# Patient Record
Sex: Male | Born: 1935
Health system: Southern US, Community
[De-identification: ages and names within clinical notes are randomized; demographics above are authoritative.]

## PROBLEM LIST (undated history)

## (undated) DIAGNOSIS — R001 Bradycardia, unspecified: Secondary | ICD-10-CM

## (undated) DIAGNOSIS — I10 Essential (primary) hypertension: Secondary | ICD-10-CM

## (undated) DIAGNOSIS — I350 Nonrheumatic aortic (valve) stenosis: Secondary | ICD-10-CM

## (undated) DIAGNOSIS — C4490 Unspecified malignant neoplasm of skin, unspecified: Secondary | ICD-10-CM

## (undated) DIAGNOSIS — G2581 Restless legs syndrome: Secondary | ICD-10-CM

## (undated) DIAGNOSIS — J309 Allergic rhinitis, unspecified: Secondary | ICD-10-CM

## (undated) HISTORY — DX: Unspecified malignant neoplasm of skin, unspecified: C44.90

## (undated) HISTORY — PX: LUMBAR DISC SURGERY: SHX700

## (undated) HISTORY — PX: CATARACT EXTRACTION: SUR2

## (undated) HISTORY — DX: Nonrheumatic aortic (valve) stenosis: I35.0

## (undated) HISTORY — PX: CAROTID ENDARTERECTOMY: SUR193

## (undated) HISTORY — DX: Bradycardia, unspecified: R00.1

## (undated) HISTORY — PX: OTHER SURGICAL HISTORY: SHX169

## (undated) HISTORY — PX: PACEMAKER PLACEMENT: SHX43

## (undated) HISTORY — DX: Allergic rhinitis, unspecified: J30.9

## (undated) HISTORY — DX: Restless legs syndrome: G25.81

## (undated) HISTORY — PX: REPLACEMENT TOTAL KNEE: SUR1224

## (undated) HISTORY — DX: Essential (primary) hypertension: I10

---

## 2004-03-29 ENCOUNTER — Encounter: Admission: RE | Admit: 2004-03-29 | Discharge: 2004-03-29 | Payer: Self-pay

## 2011-07-04 DIAGNOSIS — I6521 Occlusion and stenosis of right carotid artery: Secondary | ICD-10-CM | POA: Insufficient documentation

## 2011-12-06 DIAGNOSIS — M4310 Spondylolisthesis, site unspecified: Secondary | ICD-10-CM | POA: Insufficient documentation

## 2011-12-06 DIAGNOSIS — M48061 Spinal stenosis, lumbar region without neurogenic claudication: Secondary | ICD-10-CM | POA: Insufficient documentation

## 2015-07-28 DIAGNOSIS — E119 Type 2 diabetes mellitus without complications: Secondary | ICD-10-CM | POA: Insufficient documentation

## 2015-07-28 DIAGNOSIS — I350 Nonrheumatic aortic (valve) stenosis: Secondary | ICD-10-CM | POA: Insufficient documentation

## 2015-07-28 DIAGNOSIS — Z950 Presence of cardiac pacemaker: Secondary | ICD-10-CM | POA: Insufficient documentation

## 2015-07-28 DIAGNOSIS — R0609 Other forms of dyspnea: Secondary | ICD-10-CM | POA: Insufficient documentation

## 2015-07-28 DIAGNOSIS — I2510 Atherosclerotic heart disease of native coronary artery without angina pectoris: Secondary | ICD-10-CM | POA: Insufficient documentation

## 2015-07-28 DIAGNOSIS — Z96653 Presence of artificial knee joint, bilateral: Secondary | ICD-10-CM | POA: Insufficient documentation

## 2015-07-28 DIAGNOSIS — Z9889 Other specified postprocedural states: Secondary | ICD-10-CM | POA: Insufficient documentation

## 2015-07-28 DIAGNOSIS — G2581 Restless legs syndrome: Secondary | ICD-10-CM | POA: Insufficient documentation

## 2015-07-28 DIAGNOSIS — Z794 Long term (current) use of insulin: Secondary | ICD-10-CM

## 2015-07-28 DIAGNOSIS — I10 Essential (primary) hypertension: Secondary | ICD-10-CM | POA: Insufficient documentation

## 2015-09-30 DIAGNOSIS — N183 Chronic kidney disease, stage 3 unspecified: Secondary | ICD-10-CM | POA: Insufficient documentation

## 2015-11-27 DIAGNOSIS — G4733 Obstructive sleep apnea (adult) (pediatric): Secondary | ICD-10-CM | POA: Insufficient documentation

## 2016-05-30 DIAGNOSIS — Z01818 Encounter for other preprocedural examination: Secondary | ICD-10-CM | POA: Insufficient documentation

## 2016-06-20 ENCOUNTER — Ambulatory Visit (INDEPENDENT_AMBULATORY_CARE_PROVIDER_SITE_OTHER): Payer: Medicare Other | Admitting: Allergy and Immunology

## 2016-06-20 ENCOUNTER — Encounter: Payer: Self-pay | Admitting: Allergy and Immunology

## 2016-06-20 VITALS — BP 144/76 | HR 86 | Resp 14 | Ht 68.0 in | Wt 216.0 lb

## 2016-06-20 DIAGNOSIS — J3089 Other allergic rhinitis: Secondary | ICD-10-CM

## 2016-06-20 MED ORDER — AZELASTINE-FLUTICASONE 137-50 MCG/ACT NA SUSP: NASAL | 5 refills | Status: DC

## 2016-06-20 NOTE — Patient Instructions (Signed)
  1. House dust mite avoidance measures  2. Dymista one spray each nostril one-2 times a day  3. If needed:   A. nasal saline  B. Allegra 180 one tablet once a day  4. Further treatment? Yes, if plan is inadequate

## 2016-06-20 NOTE — Progress Notes (Signed)
Follow-up Note  Referring Provider: No ref. provider found Primary Provider: Ocie Doyne, MD Date of Office Visit: 06/20/2016  Subjective:   Charles Hahn (DOB: 10-Jan-1934) is a 83 y.o. male who [facility]2016 in re-evaluation of the following:  HPI: [name] presents to this clinic in evaluation of allergic rhinoconjunctivitis. I had seen him in this clinic over 2 years ago for that exact same problem.  He continues to have nasal congestion and runny nose with clear rhinorrhea without a tremendous amount of sneezing or headaches or any other significant upper airway issue. He has tried [facility] but it gave rise to nasal bleeding. He has tried Zyrtec which has not helped him. He has not performed any house dust avoidance measures even though he was very allergic to house dust mite on previous evaluation.  Allergies as of 06/20/2016      Reactions   Nsaids Other (See Comments)   Pt reports stomach ache with NSAIDS   Aspirin Other (See Comments)   Okay with 81 mg dose, but 325 mg dose causes stomach pain      Medication List      aspirin 81 MG tablet Take by mouth.   carvedilol 25 MG tablet Commonly known as:  COREG Take 25 mg by mouth 2 (two) times daily with a meal.   furosemide 40 MG tablet Commonly known as:  LASIX Take 60 mg by mouth daily.   HUMULIN R U-500 KWIKPEN 500 UNIT/ML kwikpen Generic drug:  insulin regular human CONCENTRATED inject 100 units before breakfast, 75 units at lunch, and 75 units at evening meal (30 minutes before the meal)   HYDROcodone-acetaminophen 5-325 MG tablet Commonly known as:  NORCO/VICODIN Take 1 tablet by mouth 4 (four) times daily as needed.   lisinopril 20 MG tablet Commonly known as:  PRINIVIL,ZESTRIL Take by mouth.   rOPINIRole 1 MG tablet Commonly known as:  REQUIP Take by mouth.   simvastatin 10 MG tablet Commonly known as:  ZOCOR Take 10 mg by mouth at bedtime.   tamsulosin 0.4 MG  Caps capsule Commonly known as:  FLOMAX Take 0.4 mg by mouth.   VICTOZA 18 MG/3ML Sopn Generic drug:  liraglutide INJECT 1.2mg  SUBCUTANEOUSLY DAILY       Past Medical History:  Diagnosis Date  . Allergic rhinitis   . Aortic stenosis   . Bradycardia   . Hypertension   . Restless leg syndrome   . Skin cancer    left nasal area    Past Surgical History:  Procedure Laterality Date  . CAROTID ENDARTERECTOMY    . CATARACT EXTRACTION Bilateral   . Inspire Therapy - sleep apnea aid    . LUMBAR DISC SURGERY    . PACEMAKER PLACEMENT    . REPLACEMENT TOTAL KNEE Bilateral   . thumb surgery Bilateral     Review of systems negative except as noted in HPI / PMHx or noted below:  Review of Systems  Constitutional: Negative.   HENT: Negative.   Eyes: Negative.   Respiratory: Negative.        3 weeks ago underwent a stimulator implant for sleep apnea with apparent innervation of hypoglossal nerve  Cardiovascular: Negative.   Gastrointestinal: Negative.   Genitourinary: Negative.   Musculoskeletal: Negative.   Skin: Negative.   Neurological: Negative.   Endo/Heme/Allergies: Negative.   Psychiatric/Behavioral: Negative.      Objective:   Vitals:   06/20/16 1521  BP: Marland Kitchen)  144/76  Pulse: 86  Resp: 14   Height: 5\' 8"  (172.7 cm)  Weight: 216 lb (98 kg)   Physical Exam  Constitutional: He is well-developed, well-nourished, and in no distress.  HENT:  Head: Normocephalic.  Right Ear: Tympanic membrane, external ear and ear canal normal.  Left Ear: Tympanic membrane, external ear and ear canal normal.  Nose: Mucosal edema present. No rhinorrhea.  Mouth/Throat: Uvula is midline, oropharynx is clear and moist and mucous membranes are normal. No oropharyngeal exudate.  Eyes: Conjunctivae are normal.  Neck: Trachea normal. No tracheal tenderness present. No tracheal deviation present. No thyromegaly present.  Cardiovascular: Normal rate, regular rhythm, S1 normal, S2 normal  and normal heart sounds.   No murmur heard. Pulmonary/Chest: Breath sounds normal. No stridor. No respiratory distress. He has no wheezes. He has no rales.  Musculoskeletal: He exhibits no edema.  Lymphadenopathy:       Head (right side): No tonsillar adenopathy present.       Head (left side): No tonsillar adenopathy present.    He has no cervical adenopathy.  Neurological: He is alert. Gait normal.  Skin: No rash noted. He is not diaphoretic. No erythema. Nails show no clubbing.  Psychiatric: Mood and affect normal.    Diagnostics: none   Assessment and Plan:   1. Other allergic rhinitis     1. House dust mite avoidance measures  2. Dymista one spray each nostril one-2 times a day  3. If needed:   A. nasal saline  B. Allegra 180 one tablet once a day  4. Further treatment? Yes, if plan is inadequate  I suspect that Parag will do relatively well if he can perform allergen avoidance measures and use anti-inflammatory agents for his respiratory tract and we'll see what happens over the course of the next several weeks. I asked him to contact me with his response to medical therapy and we'll make a decision about how to proceed pending his response.  Allena Katz, MD Allergy / Immunology [place]

## 2016-07-05 ENCOUNTER — Telehealth: Payer: Self-pay | Admitting: *Deleted

## 2016-07-05 NOTE — Telephone Encounter (Signed)
Patient called advised when using the Dymista and Allergra together had blood from nose and gave him headache.  Advised him probably both dried him out try using one or the other and let us know outcome and he agreed

## 2018-04-09 ENCOUNTER — Other Ambulatory Visit: Payer: Self-pay

## 2018-04-09 DIAGNOSIS — I739 Peripheral vascular disease, unspecified: Secondary | ICD-10-CM

## 2018-06-13 ENCOUNTER — Encounter (HOSPITAL_COMMUNITY): Payer: Self-pay

## 2018-06-13 ENCOUNTER — Encounter: Payer: Self-pay | Admitting: Vascular Surgery

## 2018-06-22 ENCOUNTER — Ambulatory Visit: Payer: Medicare Other | Admitting: Sports Medicine

## 2018-06-22 ENCOUNTER — Other Ambulatory Visit: Payer: Self-pay

## 2018-06-22 ENCOUNTER — Encounter: Payer: Self-pay | Admitting: Sports Medicine

## 2018-06-22 ENCOUNTER — Ambulatory Visit (INDEPENDENT_AMBULATORY_CARE_PROVIDER_SITE_OTHER): Payer: Medicare Other

## 2018-06-22 VITALS — BP 146/65 | HR 61 | Temp 97.9°F | Resp 16 | Ht 69.0 in | Wt 205.0 lb

## 2018-06-22 DIAGNOSIS — L97519 Non-pressure chronic ulcer of other part of right foot with unspecified severity: Secondary | ICD-10-CM

## 2018-06-22 DIAGNOSIS — M79674 Pain in right toe(s): Secondary | ICD-10-CM

## 2018-06-22 DIAGNOSIS — E11621 Type 2 diabetes mellitus with foot ulcer: Secondary | ICD-10-CM | POA: Diagnosis not present

## 2018-06-22 DIAGNOSIS — E1140 Type 2 diabetes mellitus with diabetic neuropathy, unspecified: Secondary | ICD-10-CM

## 2018-06-22 NOTE — Progress Notes (Signed)
Subjective: Charles Hahn is a 85 y.o. male patient seen in office for evaluation of ulceration of the Right great toe.  Patient reports that last fall he bumped the toe on a wooden pallet and always had problems since then with pain pain is sharp and burning 1-8 out of 10.  Patient reports over the last 2 months the wound at the toe has been oozing a clear drainage and swelling as treated the area using diabetic foot cream.  Patient has a history of diabetes and a blood glucose level today of 400 mg/dl.  Reports elevated blood sugar secondary to steroid injection given last week. Denies nausea/fever/vomiting/chills/night sweats/shortness of breath. Patient has no other pedal complaints at this time.  Patient does admit that he had a blood flow study last month that showed that the right was good with the left was off a little.  Last A1c 8.2 and last visit to Dr. Micheal Likens primary care was 3 months ago.  Review of Systems  Skin:       Color change with wound  All other systems reviewed and are negative.    Patient Active Problem List   Diagnosis Date Noted  . Preprocedural examination 05/30/2016  . OSA (obstructive sleep apnea) 11/27/2015  . CKD (chronic kidney disease) stage 3, GFR 30-59 ml/min (HCC) 09/30/2015  . Aortic stenosis, moderate 07/28/2015  . Cardiac pacemaker 07/28/2015  . Coronary artery disease involving native coronary artery of native heart without angina pectoris 07/28/2015  . Diabetes mellitus type 2, insulin dependent ([place]) 07/28/2015  . Dyspnea on exertion 07/28/2015  . H/O carotid endarterectomy 07/28/2015  . Hx of total knee arthroplasty, bilateral 07/28/2015  . Hypertension, essential 07/28/2015  . Restless legs syndrome 07/28/2015  . Lumbar stenosis 12/06/2011  . Spondylolisthesis 12/06/2011  . Asymptomatic stenosis of right carotid artery 07/04/2011   Current Outpatient Medications on File Prior to Visit  Medication Sig Dispense Refill  . carvedilol (COREG) 25 MG  tablet Take 25 mg by mouth 2 (two) times daily with a meal.  3  . HYDROcodone-acetaminophen (NORCO/VICODIN) 5-325 MG tablet Take 1 tablet by mouth 4 (four) times daily as needed.  0  . rOPINIRole (REQUIP) 1 MG tablet Take by mouth.    . simvastatin (ZOCOR) 10 MG tablet Take 10 mg by mouth at bedtime.  12  . tamsulosin (FLOMAX) 0.4 MG CAPS capsule Take 0.4 mg by mouth.    Marland Kitchen VICTOZA 18 MG/3ML SOPN INJECT 1.2mg  SUBCUTANEOUSLY DAILY  11   No current facility-administered medications on file prior to visit.    Allergies  Allergen Reactions  . Nsaids Other (See Comments)    Pt reports stomach ache with NSAIDS  . Aspirin Other (See Comments)    Okay with 81 mg dose, but 325 mg dose causes stomach pain    No results found for this or any previous visit (from the past 2160 hour(s)).  Objective: Vitals:   06/22/18 1019  Weight: 205 lb (93 kg)  Height: 5\' 9"  (1.753 m)    General: Patient is awake, alert, oriented x 3 and in no acute distress.  Dermatology: Skin is warm and dry bilateral with a partial thickness ulceration present  Right great toe distal tuft. Ulceration measures 0.5 cm x 0.8 cm x 0.1 cm. There is a macerated and Keratotic border with a granular base. The ulceration does not  probe to bone. There is no malodor, no active drainage, no erythema, no edema. No acute signs of infection.  Vascular: Dorsalis Pedis pulse = 1/4 Bilateral,  Posterior Tibial pulse = 0/4 Bilateral,  Capillary Fill Time < 5 seconds  Neurologic: Protective sensation diminished bilateral wheezing seen Weinstein monofilament.  Musculosketal: There is no pain with palpation to ulcerated area at right great toe. No pain with compression to calves bilateral. No gross bony deformities noted bilateral.  Xrays,Right foot: Ulcer defect.  No bony destruction suggestive of osteomyelitis. No gas in soft tissues.  No other acute findings.  No results for input(s): GRAMSTAIN, LABORGA in the last 8760  hours.  Assessment and Plan:  Problem List Items Addressed This Visit    None    Visit Diagnoses    Diabetic ulcer of right great toe ([place])    -  Primary   Relevant Orders   WOUND CULTURE   Great toe pain, right       Relevant Orders   DG Foot Complete Right   Type 2 diabetes mellitus with diabetic neuropathy, unspecified whether long term insulin use ([place])           -Examined patient and discussed the progression of the wound and treatment alternatives. -Xrays reviewed - Excisionally dedbrided ulceration at the medial joint line to healthy bleeding borders removing nonviable tissue using a sterile chisel blade. Wound measures post debridement as above. Wound was debrided to the level of the dermis with viable wound base exposed to promote healing. Hemostasis was achieved with manuel pressure. Patient tolerated procedure well without any discomfort or anesthesia necessary for this wound debridement.  -Wound culture was obtained -Applied Iodosorb and dry sterile dressing and instructed patient to continue with daily dressings at home consisting of same at home daily -Dispensed post op shoe  - Advised patient to go to the ER or return to office if the wound worsens or if constitutional symptoms are present. -Patient to return to office in 1-2 weeks for follow up care and evaluation or sooner if problems arise.  Landis Martins, DPM

## 2018-06-22 NOTE — Progress Notes (Signed)
   Subjective:    Patient ID: Charles Hahn, male    DOB: [birth year], 85 y.o.   MRN: [id number]  HPI    Review of Systems  Skin: Positive for color change and wound.  All other systems reviewed and are negative.      Objective:   Physical Exam        Assessment & Plan:

## 2018-06-25 ENCOUNTER — Other Ambulatory Visit: Payer: Self-pay | Admitting: Sports Medicine

## 2018-06-25 ENCOUNTER — Telehealth: Payer: Self-pay | Admitting: *Deleted

## 2018-06-25 DIAGNOSIS — E11621 Type 2 diabetes mellitus with foot ulcer: Secondary | ICD-10-CM

## 2018-06-25 DIAGNOSIS — M79674 Pain in right toe(s): Secondary | ICD-10-CM

## 2018-06-25 DIAGNOSIS — L97519 Non-pressure chronic ulcer of other part of right foot with unspecified severity: Secondary | ICD-10-CM

## 2018-06-25 LAB — WOUND CULTURE

## 2018-06-25 NOTE — Telephone Encounter (Signed)
Left message requesting a call to discuss results. 

## 2018-06-25 NOTE — Telephone Encounter (Signed)
-----   Message from Landis Martins, [place] sent at 06/25/2018 10:10 AM EST ----- Culture normal skin flora. No antibiotics my mouth needed at this time. -Dr. Cannon Kettle

## 2018-06-26 NOTE — Telephone Encounter (Signed)
I informed pt of Dr. Leeanne Rio review of results 06/25/2018 4:15pm.

## 2018-06-27 ENCOUNTER — Encounter (HOSPITAL_COMMUNITY): Payer: Self-pay

## 2018-06-27 ENCOUNTER — Encounter: Payer: Self-pay | Admitting: Vascular Surgery

## 2018-07-06 ENCOUNTER — Ambulatory Visit: Payer: Medicare Other | Admitting: Sports Medicine

## 2018-07-06 ENCOUNTER — Encounter: Payer: Self-pay | Admitting: Sports Medicine

## 2018-07-06 VITALS — BP 154/68 | HR 63 | Temp 98.1°F | Resp 16

## 2018-07-06 DIAGNOSIS — M79674 Pain in right toe(s): Secondary | ICD-10-CM

## 2018-07-06 DIAGNOSIS — L97519 Non-pressure chronic ulcer of other part of right foot with unspecified severity: Secondary | ICD-10-CM | POA: Diagnosis not present

## 2018-07-06 DIAGNOSIS — E11621 Type 2 diabetes mellitus with foot ulcer: Secondary | ICD-10-CM

## 2018-07-06 DIAGNOSIS — E1140 Type 2 diabetes mellitus with diabetic neuropathy, unspecified: Secondary | ICD-10-CM

## 2018-07-06 NOTE — Progress Notes (Signed)
Subjective: Charles Hahn is a 85 y.o. male patient seen in office for follow-up evaluation of ulceration of the Right great toe.  Patient reports that his wife is been changing it and he cannot tell much of a difference has 5 out of 10 shooting pain every once in a while.  Denies nausea vomiting fever chills or any constitutional symptoms.  Wife has been helping change dressing using Iodosorb with a small amount of clear fluid noted.  FBS today 56  Patient Active Problem List   Diagnosis Date Noted  . Preprocedural examination 05/30/2016  . OSA (obstructive sleep apnea) 11/27/2015  . CKD (chronic kidney disease) stage 3, GFR 30-59 ml/min (HCC) 09/30/2015  . Aortic stenosis, moderate 07/28/2015  . Cardiac pacemaker 07/28/2015  . Coronary artery disease involving native coronary artery of native heart without angina pectoris 07/28/2015  . Diabetes mellitus type 2, insulin dependent ([place]) 07/28/2015  . Dyspnea on exertion 07/28/2015  . H/O carotid endarterectomy 07/28/2015  . Hx of total knee arthroplasty, bilateral 07/28/2015  . Hypertension, essential 07/28/2015  . Restless legs syndrome 07/28/2015  . Lumbar stenosis 12/06/2011  . Spondylolisthesis 12/06/2011  . Asymptomatic stenosis of right carotid artery 07/04/2011   Current Outpatient Medications on File Prior to Visit  Medication Sig Dispense Refill  . carvedilol (COREG) 25 MG tablet Take 25 mg by mouth 2 (two) times daily with a meal.  3  . ENTRESTO 49-51 MG Take 1 tablet by mouth 2 (two) times daily.    Marland Kitchen HUMULIN R U-500 KWIKPEN 500 UNIT/ML kwikpen inject 120 units before breakfast, 80 units at lunch, 80 units at evening meal Three times a day (30 minutes before the meal)    . HYDROcodone-acetaminophen (NORCO/VICODIN) 5-325 MG tablet Take 1 tablet by mouth 4 (four) times daily as needed.  0  . methocarbamol (ROBAXIN) 500 MG tablet Take by mouth.    . pantoprazole (PROTONIX) 40 MG tablet Take 40 mg by mouth daily.    . predniSONE  (DELTASONE) 10 MG tablet By mouth daily, taper number of pills: 6,5,4,3,2,1    . rOPINIRole (REQUIP) 1 MG tablet Take by mouth.    . simvastatin (ZOCOR) 10 MG tablet Take 10 mg by mouth at bedtime.  12  . tamsulosin (FLOMAX) 0.4 MG CAPS capsule Take 0.4 mg by mouth.    . torsemide (DEMADEX) 20 MG tablet     . VICTOZA 18 MG/3ML SOPN INJECT 1.2mg  SUBCUTANEOUSLY DAILY  11   No current facility-administered medications on file prior to visit.    Allergies  Allergen Reactions  . Isoflurane Other (See Comments)    Possible Malignant Hyperthermia Possible Malignant Hyperthermia   . Succinylcholine Other (See Comments)    Possible Malignant Hyperthermia Possible Malignant Hyperthermia   . Acetaminophen Nausea And Vomiting    Only 325 mg  -  Ok with 81 mg Only 325 mg  -  Ok with 81 mg   . Nsaids Other (See Comments)    Pt reports stomach ache with NSAIDS Pt reports stomach ache with NSAIDS  . Aspirin Other (See Comments)    Okay with 81 mg dose, but 325 mg dose causes stomach pain    Recent Results (from the past 2160 hour(s))  WOUND CULTURE     Status: None   Collection Time: 06/22/18 10:23 AM  Result Value Ref Range   Gram Stain Result Final report    Organism ID, Bacteria Comment     Comment: No white blood cells seen.  Organism ID, Bacteria Comment     Comment: Many gram positive cocci.   Organism ID, Bacteria Comment     Comment: Moderate gram negative rods.   Aerobic Bacterial Culture Final report    Organism ID, Bacteria Comment     Comment: Mixed skin flora including multiple gram negative rods. Moderate growth     Objective: There were no vitals filed for this visit.  General: Patient is awake, alert, oriented x 3 and in no acute distress.  Dermatology: Skin is warm and dry bilateral with a partial thickness ulceration present  Right great toe distal tuft. Ulceration measures 0.5 cm x 0.5 cm x 0.1 cm. There is a macerated and Keratotic border with a granular  base. The ulceration does not  probe to bone. There is no malodor, no active drainage, no erythema, no edema. No acute signs of infection.   Vascular: Dorsalis Pedis pulse = 1/4 Bilateral,  Posterior Tibial pulse = 0/4 Bilateral,  Capillary Fill Time < 5 seconds  Neurologic: Protective sensation diminished bilateral wheezing seen Weinstein monofilament.  Musculosketal: There is no pain with palpation to ulcerated area at right great toe. No pain with compression to calves bilateral. No gross bony deformities noted bilateral.  No results for input(s): GRAMSTAIN, LABORGA in the last 8760 hours.  Assessment and Plan:  Problem List Items Addressed This Visit    None    Visit Diagnoses    Diabetic ulcer of right great toe ([place])    -  Primary   Relevant Medications   HUMULIN R U-500 KWIKPEN 500 UNIT/ML kwikpen   Great toe pain, right       Type 2 diabetes mellitus with diabetic neuropathy, unspecified whether long term insulin use (HCC)       Relevant Medications   HUMULIN R U-500 KWIKPEN 500 UNIT/ML kwikpen     -Examined patient and discussed the progression of the wound and treatment alternatives. - Excisionally dedbrided ulceration at the right great toe distal tuft to healthy bleeding borders removing nonviable tissue using a sterile chisel blade. Wound measures post debridement as above. Wound was debrided to the level of the dermis with viable wound base exposed to promote healing. Hemostasis was achieved with manuel pressure. Patient tolerated procedure well without any discomfort or anesthesia necessary for this wound debridement.  -Applied Iodosorb and dry sterile dressing and instructed patient to continue with daily dressings at home consisting of same at home daily -Dispensed post op shoe at this visit because last visit did not have his proper size - Advised patient to go to the ER or return to office if the wound worsens or if constitutional symptoms are present. -Patient to  return to office in 2-3 weeks for follow up care and evaluation or sooner if problems arise.  Advised patient at next visit if his wound is slow to heal we will reorder circulation test even though he had one done last month to see if there is any abrupt change due to history of toe ulcer.  Landis Martins, DPM

## 2018-07-25 ENCOUNTER — Other Ambulatory Visit: Payer: Self-pay | Admitting: Rehabilitation

## 2018-07-25 DIAGNOSIS — M47816 Spondylosis without myelopathy or radiculopathy, lumbar region: Secondary | ICD-10-CM

## 2018-07-27 ENCOUNTER — Ambulatory Visit: Payer: Medicare Other | Admitting: Sports Medicine

## 2018-08-15 ENCOUNTER — Telehealth: Payer: Self-pay | Admitting: *Deleted

## 2018-08-15 ENCOUNTER — Other Ambulatory Visit: Payer: Self-pay | Admitting: Sports Medicine

## 2018-08-15 DIAGNOSIS — E11621 Type 2 diabetes mellitus with foot ulcer: Secondary | ICD-10-CM

## 2018-08-15 DIAGNOSIS — L97519 Non-pressure chronic ulcer of other part of right foot with unspecified severity: Principal | ICD-10-CM

## 2018-08-15 MED ORDER — CADEXOMER IODINE 0.9 % EX GEL: 1.0000 "application " | Freq: Every day | CUTANEOUS | 0 refills | Status: AC | PRN

## 2018-08-15 NOTE — Telephone Encounter (Signed)
Notified patient Dr Cannon Kettle called in Rx to [facility] in [place].

## 2018-08-15 NOTE — Telephone Encounter (Signed)
Patient called requesting an Rx for Iodosorb be called in to his pharmacy.  He has almost finished the tube he was given.

## 2018-08-15 NOTE — Progress Notes (Signed)
Notified patient Dr Cannon Kettle called in Rx to [facility] in [place]

## 2018-08-15 NOTE — Progress Notes (Signed)
Sent Rx for Iodosorb to [facility] -Dr. Cannon Kettle

## 2018-11-08 ENCOUNTER — Other Ambulatory Visit: Payer: Self-pay | Admitting: Orthopaedic Surgery

## 2018-11-08 ENCOUNTER — Telehealth: Payer: Self-pay | Admitting: Nurse Practitioner

## 2018-11-08 DIAGNOSIS — M47816 Spondylosis without myelopathy or radiculopathy, lumbar region: Secondary | ICD-10-CM

## 2018-11-08 NOTE — Telephone Encounter (Signed)
Phone call to patient to verify medication list and allergies for myelogram procedure. Pt aware he will not need to hold any medications for this procedure. Pre and post procedure instructions reviewed with pt. Pt verbalized understanding.  

## 2018-11-29 ENCOUNTER — Inpatient Hospital Stay
Admission: RE | Admit: 2018-11-29 | Discharge: 2018-11-29 | Disposition: A | Discharge status: Home / Self Care | Payer: Medicare Other | Source: Ambulatory Visit | Attending: Orthopaedic Surgery | Admitting: Orthopaedic Surgery

## 2018-11-29 ENCOUNTER — Other Ambulatory Visit: Payer: Medicare Other

## 2018-11-29 NOTE — Discharge Instructions (Signed)

## 2018-12-31 ENCOUNTER — Other Ambulatory Visit: Payer: Medicare Other

## 2019-01-21 ENCOUNTER — Ambulatory Visit
Admission: RE | Admit: 2019-01-21 | Discharge: 2019-01-21 | Disposition: A | Discharge status: Home / Self Care | Payer: Medicare Other | Source: Ambulatory Visit | Attending: Orthopaedic Surgery | Admitting: Orthopaedic Surgery

## 2019-01-21 DIAGNOSIS — M47816 Spondylosis without myelopathy or radiculopathy, lumbar region: Secondary | ICD-10-CM

## 2019-01-21 MED ORDER — IOPAMIDOL (ISOVUE-M 200) INJECTION 41%
15.0000 mL | Freq: Once | INTRAMUSCULAR | Status: AC
  Administered 2019-01-21: 15 mL via INTRATHECAL

## 2019-01-21 MED ORDER — DIAZEPAM 5 MG PO TABS
5.0000 mg | ORAL_TABLET | Freq: Once | ORAL | Status: AC
  Administered 2019-01-21: 5 mg via ORAL

## 2019-01-21 NOTE — Discharge Instructions (Signed)

## 2019-05-13 DIAGNOSIS — Z952 Presence of prosthetic heart valve: Secondary | ICD-10-CM | POA: Diagnosis not present

## 2019-05-13 DIAGNOSIS — I10 Essential (primary) hypertension: Secondary | ICD-10-CM | POA: Diagnosis not present

## 2019-05-13 DIAGNOSIS — K219 Gastro-esophageal reflux disease without esophagitis: Secondary | ICD-10-CM | POA: Diagnosis not present

## 2019-05-13 DIAGNOSIS — E118 Type 2 diabetes mellitus with unspecified complications: Secondary | ICD-10-CM | POA: Diagnosis not present

## 2019-05-13 DIAGNOSIS — I50810 Right heart failure, unspecified: Secondary | ICD-10-CM | POA: Diagnosis not present

## 2019-05-13 DIAGNOSIS — N401 Enlarged prostate with lower urinary tract symptoms: Secondary | ICD-10-CM | POA: Diagnosis not present

## 2019-05-13 DIAGNOSIS — M545 Low back pain: Secondary | ICD-10-CM | POA: Diagnosis not present

## 2019-05-13 DIAGNOSIS — Z950 Presence of cardiac pacemaker: Secondary | ICD-10-CM | POA: Diagnosis not present

## 2019-05-13 DIAGNOSIS — G2581 Restless legs syndrome: Secondary | ICD-10-CM | POA: Diagnosis not present

## 2019-05-14 DIAGNOSIS — Z471 Aftercare following joint replacement surgery: Secondary | ICD-10-CM | POA: Diagnosis not present

## 2019-05-14 DIAGNOSIS — Z96652 Presence of left artificial knee joint: Secondary | ICD-10-CM | POA: Diagnosis not present

## 2019-05-24 DIAGNOSIS — N1832 Chronic kidney disease, stage 3b: Secondary | ICD-10-CM | POA: Diagnosis not present

## 2019-05-24 DIAGNOSIS — I2510 Atherosclerotic heart disease of native coronary artery without angina pectoris: Secondary | ICD-10-CM | POA: Diagnosis not present

## 2019-05-24 DIAGNOSIS — E1122 Type 2 diabetes mellitus with diabetic chronic kidney disease: Secondary | ICD-10-CM | POA: Diagnosis not present

## 2019-05-24 DIAGNOSIS — Z794 Long term (current) use of insulin: Secondary | ICD-10-CM | POA: Diagnosis not present

## 2019-05-24 DIAGNOSIS — E1142 Type 2 diabetes mellitus with diabetic polyneuropathy: Secondary | ICD-10-CM | POA: Diagnosis not present

## 2019-05-24 DIAGNOSIS — E1165 Type 2 diabetes mellitus with hyperglycemia: Secondary | ICD-10-CM | POA: Diagnosis not present

## 2019-06-13 DIAGNOSIS — N401 Enlarged prostate with lower urinary tract symptoms: Secondary | ICD-10-CM | POA: Diagnosis not present

## 2019-06-13 DIAGNOSIS — Z952 Presence of prosthetic heart valve: Secondary | ICD-10-CM | POA: Diagnosis not present

## 2019-06-13 DIAGNOSIS — I50810 Right heart failure, unspecified: Secondary | ICD-10-CM | POA: Diagnosis not present

## 2019-06-13 DIAGNOSIS — G2581 Restless legs syndrome: Secondary | ICD-10-CM | POA: Diagnosis not present

## 2019-06-13 DIAGNOSIS — Z950 Presence of cardiac pacemaker: Secondary | ICD-10-CM | POA: Diagnosis not present

## 2019-06-13 DIAGNOSIS — Z9181 History of falling: Secondary | ICD-10-CM | POA: Diagnosis not present

## 2019-06-13 DIAGNOSIS — K219 Gastro-esophageal reflux disease without esophagitis: Secondary | ICD-10-CM | POA: Diagnosis not present

## 2019-06-13 DIAGNOSIS — Z139 Encounter for screening, unspecified: Secondary | ICD-10-CM | POA: Diagnosis not present

## 2019-06-13 DIAGNOSIS — M545 Low back pain: Secondary | ICD-10-CM | POA: Diagnosis not present

## 2019-06-19 DIAGNOSIS — Z79899 Other long term (current) drug therapy: Secondary | ICD-10-CM | POA: Diagnosis not present

## 2019-07-03 DIAGNOSIS — I428 Other cardiomyopathies: Secondary | ICD-10-CM | POA: Diagnosis not present

## 2019-07-03 DIAGNOSIS — I442 Atrioventricular block, complete: Secondary | ICD-10-CM | POA: Diagnosis not present

## 2019-07-03 DIAGNOSIS — I429 Cardiomyopathy, unspecified: Secondary | ICD-10-CM | POA: Diagnosis not present

## 2019-07-03 DIAGNOSIS — Z952 Presence of prosthetic heart valve: Secondary | ICD-10-CM | POA: Diagnosis not present

## 2019-07-03 DIAGNOSIS — I495 Sick sinus syndrome: Secondary | ICD-10-CM | POA: Diagnosis not present

## 2019-07-03 DIAGNOSIS — I509 Heart failure, unspecified: Secondary | ICD-10-CM | POA: Diagnosis not present

## 2019-07-04 DIAGNOSIS — T8454XD Infection and inflammatory reaction due to internal left knee prosthesis, subsequent encounter: Secondary | ICD-10-CM | POA: Diagnosis not present

## 2019-07-12 DIAGNOSIS — I50810 Right heart failure, unspecified: Secondary | ICD-10-CM | POA: Diagnosis not present

## 2019-07-12 DIAGNOSIS — I10 Essential (primary) hypertension: Secondary | ICD-10-CM | POA: Diagnosis not present

## 2019-07-12 DIAGNOSIS — Z950 Presence of cardiac pacemaker: Secondary | ICD-10-CM | POA: Diagnosis not present

## 2019-07-12 DIAGNOSIS — K219 Gastro-esophageal reflux disease without esophagitis: Secondary | ICD-10-CM | POA: Diagnosis not present

## 2019-07-12 DIAGNOSIS — G2581 Restless legs syndrome: Secondary | ICD-10-CM | POA: Diagnosis not present

## 2019-07-12 DIAGNOSIS — Z952 Presence of prosthetic heart valve: Secondary | ICD-10-CM | POA: Diagnosis not present

## 2019-07-12 DIAGNOSIS — E118 Type 2 diabetes mellitus with unspecified complications: Secondary | ICD-10-CM | POA: Diagnosis not present

## 2019-07-12 DIAGNOSIS — M545 Low back pain: Secondary | ICD-10-CM | POA: Diagnosis not present

## 2019-07-12 DIAGNOSIS — N401 Enlarged prostate with lower urinary tract symptoms: Secondary | ICD-10-CM | POA: Diagnosis not present

## 2019-07-16 DIAGNOSIS — I420 Dilated cardiomyopathy: Secondary | ICD-10-CM | POA: Diagnosis not present

## 2019-07-16 DIAGNOSIS — I2510 Atherosclerotic heart disease of native coronary artery without angina pectoris: Secondary | ICD-10-CM | POA: Diagnosis not present

## 2019-07-16 DIAGNOSIS — I10 Essential (primary) hypertension: Secondary | ICD-10-CM | POA: Diagnosis not present

## 2019-07-16 DIAGNOSIS — Z952 Presence of prosthetic heart valve: Secondary | ICD-10-CM | POA: Diagnosis not present

## 2019-07-16 DIAGNOSIS — Z8679 Personal history of other diseases of the circulatory system: Secondary | ICD-10-CM | POA: Diagnosis not present

## 2019-08-01 DIAGNOSIS — D649 Anemia, unspecified: Secondary | ICD-10-CM | POA: Diagnosis not present

## 2019-08-01 DIAGNOSIS — E559 Vitamin D deficiency, unspecified: Secondary | ICD-10-CM | POA: Diagnosis not present

## 2019-08-01 DIAGNOSIS — I10 Essential (primary) hypertension: Secondary | ICD-10-CM | POA: Diagnosis not present

## 2019-08-01 DIAGNOSIS — N189 Chronic kidney disease, unspecified: Secondary | ICD-10-CM | POA: Diagnosis not present

## 2019-08-01 DIAGNOSIS — R809 Proteinuria, unspecified: Secondary | ICD-10-CM | POA: Diagnosis not present

## 2019-08-01 DIAGNOSIS — E1169 Type 2 diabetes mellitus with other specified complication: Secondary | ICD-10-CM | POA: Diagnosis not present

## 2019-08-01 DIAGNOSIS — I509 Heart failure, unspecified: Secondary | ICD-10-CM | POA: Diagnosis not present

## 2019-08-01 DIAGNOSIS — N183 Chronic kidney disease, stage 3 unspecified: Secondary | ICD-10-CM | POA: Diagnosis not present

## 2019-08-01 DIAGNOSIS — T8454XD Infection and inflammatory reaction due to internal left knee prosthesis, subsequent encounter: Secondary | ICD-10-CM | POA: Diagnosis not present

## 2019-08-01 DIAGNOSIS — E785 Hyperlipidemia, unspecified: Secondary | ICD-10-CM | POA: Diagnosis not present

## 2019-08-01 DIAGNOSIS — R309 Painful micturition, unspecified: Secondary | ICD-10-CM | POA: Diagnosis not present

## 2019-08-05 DIAGNOSIS — R351 Nocturia: Secondary | ICD-10-CM | POA: Diagnosis not present

## 2019-08-05 DIAGNOSIS — N401 Enlarged prostate with lower urinary tract symptoms: Secondary | ICD-10-CM | POA: Diagnosis not present

## 2019-08-12 DIAGNOSIS — I50810 Right heart failure, unspecified: Secondary | ICD-10-CM | POA: Diagnosis not present

## 2019-08-12 DIAGNOSIS — K219 Gastro-esophageal reflux disease without esophagitis: Secondary | ICD-10-CM | POA: Diagnosis not present

## 2019-08-12 DIAGNOSIS — E118 Type 2 diabetes mellitus with unspecified complications: Secondary | ICD-10-CM | POA: Diagnosis not present

## 2019-08-12 DIAGNOSIS — Z139 Encounter for screening, unspecified: Secondary | ICD-10-CM | POA: Diagnosis not present

## 2019-08-12 DIAGNOSIS — I10 Essential (primary) hypertension: Secondary | ICD-10-CM | POA: Diagnosis not present

## 2019-08-12 DIAGNOSIS — G2581 Restless legs syndrome: Secondary | ICD-10-CM | POA: Diagnosis not present

## 2019-08-12 DIAGNOSIS — Z950 Presence of cardiac pacemaker: Secondary | ICD-10-CM | POA: Diagnosis not present

## 2019-08-12 DIAGNOSIS — M545 Low back pain: Secondary | ICD-10-CM | POA: Diagnosis not present

## 2019-08-12 DIAGNOSIS — E785 Hyperlipidemia, unspecified: Secondary | ICD-10-CM | POA: Diagnosis not present

## 2019-09-11 DIAGNOSIS — M545 Low back pain: Secondary | ICD-10-CM | POA: Diagnosis not present

## 2019-09-20 DIAGNOSIS — E1165 Type 2 diabetes mellitus with hyperglycemia: Secondary | ICD-10-CM | POA: Diagnosis not present

## 2019-09-20 DIAGNOSIS — N1831 Chronic kidney disease, stage 3a: Secondary | ICD-10-CM | POA: Diagnosis not present

## 2019-09-20 DIAGNOSIS — E1122 Type 2 diabetes mellitus with diabetic chronic kidney disease: Secondary | ICD-10-CM | POA: Diagnosis not present

## 2019-09-20 DIAGNOSIS — E1142 Type 2 diabetes mellitus with diabetic polyneuropathy: Secondary | ICD-10-CM | POA: Diagnosis not present

## 2019-09-20 DIAGNOSIS — Z794 Long term (current) use of insulin: Secondary | ICD-10-CM | POA: Diagnosis not present

## 2019-09-20 DIAGNOSIS — I2510 Atherosclerotic heart disease of native coronary artery without angina pectoris: Secondary | ICD-10-CM | POA: Diagnosis not present

## 2019-09-26 DIAGNOSIS — Z96651 Presence of right artificial knee joint: Secondary | ICD-10-CM | POA: Diagnosis not present

## 2019-09-26 DIAGNOSIS — M25561 Pain in right knee: Secondary | ICD-10-CM | POA: Diagnosis not present

## 2019-09-26 DIAGNOSIS — Z471 Aftercare following joint replacement surgery: Secondary | ICD-10-CM | POA: Diagnosis not present

## 2019-09-30 DIAGNOSIS — I110 Hypertensive heart disease with heart failure: Secondary | ICD-10-CM | POA: Diagnosis not present

## 2019-09-30 DIAGNOSIS — I50810 Right heart failure, unspecified: Secondary | ICD-10-CM | POA: Diagnosis not present

## 2019-09-30 DIAGNOSIS — N189 Chronic kidney disease, unspecified: Secondary | ICD-10-CM | POA: Diagnosis not present

## 2019-09-30 DIAGNOSIS — E118 Type 2 diabetes mellitus with unspecified complications: Secondary | ICD-10-CM | POA: Diagnosis not present

## 2019-10-02 DIAGNOSIS — I428 Other cardiomyopathies: Secondary | ICD-10-CM | POA: Diagnosis not present

## 2019-10-02 DIAGNOSIS — I509 Heart failure, unspecified: Secondary | ICD-10-CM | POA: Diagnosis not present

## 2019-10-02 DIAGNOSIS — I442 Atrioventricular block, complete: Secondary | ICD-10-CM | POA: Diagnosis not present

## 2019-10-02 DIAGNOSIS — I480 Paroxysmal atrial fibrillation: Secondary | ICD-10-CM | POA: Diagnosis not present

## 2019-10-10 DIAGNOSIS — E1142 Type 2 diabetes mellitus with diabetic polyneuropathy: Secondary | ICD-10-CM | POA: Diagnosis not present

## 2019-10-10 DIAGNOSIS — Z794 Long term (current) use of insulin: Secondary | ICD-10-CM | POA: Diagnosis not present

## 2019-10-11 DIAGNOSIS — Z950 Presence of cardiac pacemaker: Secondary | ICD-10-CM | POA: Diagnosis not present

## 2019-10-11 DIAGNOSIS — G2581 Restless legs syndrome: Secondary | ICD-10-CM | POA: Diagnosis not present

## 2019-10-11 DIAGNOSIS — M545 Low back pain: Secondary | ICD-10-CM | POA: Diagnosis not present

## 2019-10-11 DIAGNOSIS — N401 Enlarged prostate with lower urinary tract symptoms: Secondary | ICD-10-CM | POA: Diagnosis not present

## 2019-10-11 DIAGNOSIS — I50810 Right heart failure, unspecified: Secondary | ICD-10-CM | POA: Diagnosis not present

## 2019-10-11 DIAGNOSIS — I10 Essential (primary) hypertension: Secondary | ICD-10-CM | POA: Diagnosis not present

## 2019-10-11 DIAGNOSIS — E118 Type 2 diabetes mellitus with unspecified complications: Secondary | ICD-10-CM | POA: Diagnosis not present

## 2019-10-11 DIAGNOSIS — K219 Gastro-esophageal reflux disease without esophagitis: Secondary | ICD-10-CM | POA: Diagnosis not present

## 2019-10-11 DIAGNOSIS — Z952 Presence of prosthetic heart valve: Secondary | ICD-10-CM | POA: Diagnosis not present

## 2019-10-22 DIAGNOSIS — I2510 Atherosclerotic heart disease of native coronary artery without angina pectoris: Secondary | ICD-10-CM | POA: Diagnosis not present

## 2019-10-22 DIAGNOSIS — I10 Essential (primary) hypertension: Secondary | ICD-10-CM | POA: Diagnosis not present

## 2019-10-22 DIAGNOSIS — I4430 Unspecified atrioventricular block: Secondary | ICD-10-CM | POA: Diagnosis not present

## 2019-10-22 DIAGNOSIS — Z952 Presence of prosthetic heart valve: Secondary | ICD-10-CM | POA: Diagnosis not present

## 2019-10-22 DIAGNOSIS — I420 Dilated cardiomyopathy: Secondary | ICD-10-CM | POA: Diagnosis not present

## 2019-10-29 DIAGNOSIS — M7918 Myalgia, other site: Secondary | ICD-10-CM | POA: Diagnosis not present

## 2019-10-29 DIAGNOSIS — M5416 Radiculopathy, lumbar region: Secondary | ICD-10-CM | POA: Diagnosis not present

## 2019-11-05 DIAGNOSIS — M5136 Other intervertebral disc degeneration, lumbar region: Secondary | ICD-10-CM | POA: Diagnosis not present

## 2019-11-05 DIAGNOSIS — M5416 Radiculopathy, lumbar region: Secondary | ICD-10-CM | POA: Diagnosis not present

## 2019-11-08 DIAGNOSIS — N401 Enlarged prostate with lower urinary tract symptoms: Secondary | ICD-10-CM | POA: Diagnosis not present

## 2019-11-08 DIAGNOSIS — I50810 Right heart failure, unspecified: Secondary | ICD-10-CM | POA: Diagnosis not present

## 2019-11-08 DIAGNOSIS — E118 Type 2 diabetes mellitus with unspecified complications: Secondary | ICD-10-CM | POA: Diagnosis not present

## 2019-11-08 DIAGNOSIS — I10 Essential (primary) hypertension: Secondary | ICD-10-CM | POA: Diagnosis not present

## 2019-11-08 DIAGNOSIS — M545 Low back pain: Secondary | ICD-10-CM | POA: Diagnosis not present

## 2019-11-08 DIAGNOSIS — Z952 Presence of prosthetic heart valve: Secondary | ICD-10-CM | POA: Diagnosis not present

## 2019-11-08 DIAGNOSIS — Z950 Presence of cardiac pacemaker: Secondary | ICD-10-CM | POA: Diagnosis not present

## 2019-11-08 DIAGNOSIS — K219 Gastro-esophageal reflux disease without esophagitis: Secondary | ICD-10-CM | POA: Diagnosis not present

## 2019-11-08 DIAGNOSIS — G2581 Restless legs syndrome: Secondary | ICD-10-CM | POA: Diagnosis not present

## 2019-11-11 DIAGNOSIS — H35363 Drusen (degenerative) of macula, bilateral: Secondary | ICD-10-CM | POA: Diagnosis not present

## 2019-11-11 DIAGNOSIS — H52223 Regular astigmatism, bilateral: Secondary | ICD-10-CM | POA: Diagnosis not present

## 2019-11-11 DIAGNOSIS — H524 Presbyopia: Secondary | ICD-10-CM | POA: Diagnosis not present

## 2019-11-11 DIAGNOSIS — Z961 Presence of intraocular lens: Secondary | ICD-10-CM | POA: Diagnosis not present

## 2019-11-11 DIAGNOSIS — E113292 Type 2 diabetes mellitus with mild nonproliferative diabetic retinopathy without macular edema, left eye: Secondary | ICD-10-CM | POA: Diagnosis not present

## 2019-11-11 DIAGNOSIS — H43813 Vitreous degeneration, bilateral: Secondary | ICD-10-CM | POA: Diagnosis not present

## 2019-11-11 DIAGNOSIS — H5203 Hypermetropia, bilateral: Secondary | ICD-10-CM | POA: Diagnosis not present

## 2019-11-11 DIAGNOSIS — H35443 Age-related reticular degeneration of retina, bilateral: Secondary | ICD-10-CM | POA: Diagnosis not present

## 2019-11-11 DIAGNOSIS — H35373 Puckering of macula, bilateral: Secondary | ICD-10-CM | POA: Diagnosis not present

## 2019-11-13 DIAGNOSIS — I340 Nonrheumatic mitral (valve) insufficiency: Secondary | ICD-10-CM | POA: Diagnosis not present

## 2019-11-13 DIAGNOSIS — I420 Dilated cardiomyopathy: Secondary | ICD-10-CM | POA: Diagnosis not present

## 2019-11-15 DIAGNOSIS — M5126 Other intervertebral disc displacement, lumbar region: Secondary | ICD-10-CM | POA: Diagnosis not present

## 2019-11-15 DIAGNOSIS — M549 Dorsalgia, unspecified: Secondary | ICD-10-CM | POA: Diagnosis not present

## 2019-11-15 DIAGNOSIS — M5416 Radiculopathy, lumbar region: Secondary | ICD-10-CM | POA: Diagnosis not present

## 2019-11-15 DIAGNOSIS — I700 Atherosclerosis of aorta: Secondary | ICD-10-CM | POA: Diagnosis not present

## 2019-11-15 DIAGNOSIS — M4316 Spondylolisthesis, lumbar region: Secondary | ICD-10-CM | POA: Diagnosis not present

## 2019-11-18 DIAGNOSIS — M5416 Radiculopathy, lumbar region: Secondary | ICD-10-CM | POA: Diagnosis not present

## 2019-11-20 DIAGNOSIS — M5416 Radiculopathy, lumbar region: Secondary | ICD-10-CM | POA: Diagnosis not present

## 2019-12-03 DIAGNOSIS — R309 Painful micturition, unspecified: Secondary | ICD-10-CM | POA: Diagnosis not present

## 2019-12-03 DIAGNOSIS — E7849 Other hyperlipidemia: Secondary | ICD-10-CM | POA: Diagnosis not present

## 2019-12-03 DIAGNOSIS — N400 Enlarged prostate without lower urinary tract symptoms: Secondary | ICD-10-CM | POA: Diagnosis not present

## 2019-12-03 DIAGNOSIS — R809 Proteinuria, unspecified: Secondary | ICD-10-CM | POA: Diagnosis not present

## 2019-12-03 DIAGNOSIS — N183 Chronic kidney disease, stage 3 unspecified: Secondary | ICD-10-CM | POA: Diagnosis not present

## 2019-12-03 DIAGNOSIS — E559 Vitamin D deficiency, unspecified: Secondary | ICD-10-CM | POA: Diagnosis not present

## 2019-12-03 DIAGNOSIS — M009 Pyogenic arthritis, unspecified: Secondary | ICD-10-CM | POA: Diagnosis not present

## 2019-12-03 DIAGNOSIS — E1169 Type 2 diabetes mellitus with other specified complication: Secondary | ICD-10-CM | POA: Diagnosis not present

## 2019-12-03 DIAGNOSIS — N189 Chronic kidney disease, unspecified: Secondary | ICD-10-CM | POA: Diagnosis not present

## 2019-12-03 DIAGNOSIS — E211 Secondary hyperparathyroidism, not elsewhere classified: Secondary | ICD-10-CM | POA: Diagnosis not present

## 2019-12-03 DIAGNOSIS — I10 Essential (primary) hypertension: Secondary | ICD-10-CM | POA: Diagnosis not present

## 2019-12-03 DIAGNOSIS — D649 Anemia, unspecified: Secondary | ICD-10-CM | POA: Diagnosis not present

## 2019-12-05 DIAGNOSIS — E1165 Type 2 diabetes mellitus with hyperglycemia: Secondary | ICD-10-CM | POA: Diagnosis not present

## 2019-12-09 DIAGNOSIS — N189 Chronic kidney disease, unspecified: Secondary | ICD-10-CM | POA: Diagnosis not present

## 2019-12-09 DIAGNOSIS — I50810 Right heart failure, unspecified: Secondary | ICD-10-CM | POA: Diagnosis not present

## 2019-12-09 DIAGNOSIS — M545 Low back pain: Secondary | ICD-10-CM | POA: Diagnosis not present

## 2019-12-09 DIAGNOSIS — K219 Gastro-esophageal reflux disease without esophagitis: Secondary | ICD-10-CM | POA: Diagnosis not present

## 2019-12-09 DIAGNOSIS — N401 Enlarged prostate with lower urinary tract symptoms: Secondary | ICD-10-CM | POA: Diagnosis not present

## 2019-12-09 DIAGNOSIS — Z950 Presence of cardiac pacemaker: Secondary | ICD-10-CM | POA: Diagnosis not present

## 2019-12-09 DIAGNOSIS — E118 Type 2 diabetes mellitus with unspecified complications: Secondary | ICD-10-CM | POA: Diagnosis not present

## 2019-12-09 DIAGNOSIS — M5382 Other specified dorsopathies, cervical region: Secondary | ICD-10-CM | POA: Diagnosis not present

## 2019-12-09 DIAGNOSIS — I10 Essential (primary) hypertension: Secondary | ICD-10-CM | POA: Diagnosis not present

## 2019-12-16 DIAGNOSIS — M461 Sacroiliitis, not elsewhere classified: Secondary | ICD-10-CM | POA: Diagnosis not present

## 2019-12-16 IMAGING — XA DG MYELOGRAPHY LUMBAR INJ LUMBOSACRAL
6 of 11 series · 6 of 11 positions shown · non-contrast
Comparison: No none ne

CLINICAL DATA: Previous lumbar discectomy and fusion. Low back and
bilateral leg pain.
TECHNIQUE: Contiguous axial images were obtained through the Lumbar spine after
the intrathecal infusion of infusion. Coronal and sagittal
reconstructions were obtained of the axial image sets.

[Series 1: vasc adipose · 1 of 1 slices shown (1 of 3)]
[im 1/1]
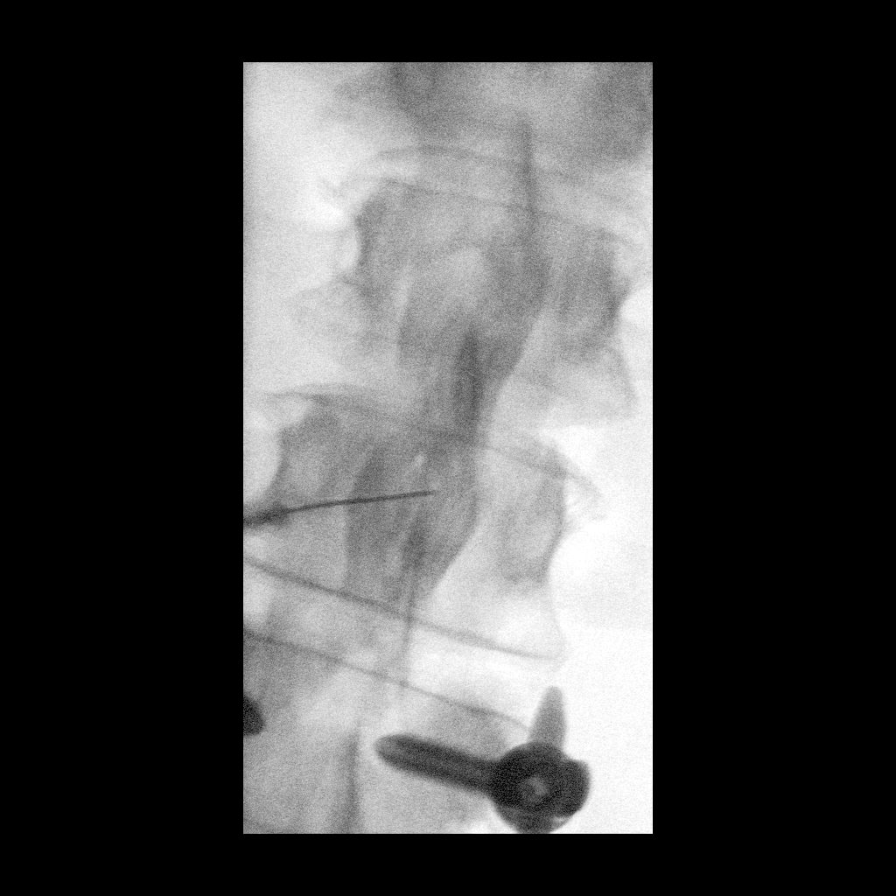

[Series 1: w lumbar spine lat · 0.15mm/px · 1 of 1 slices shown]
[im 1/1]
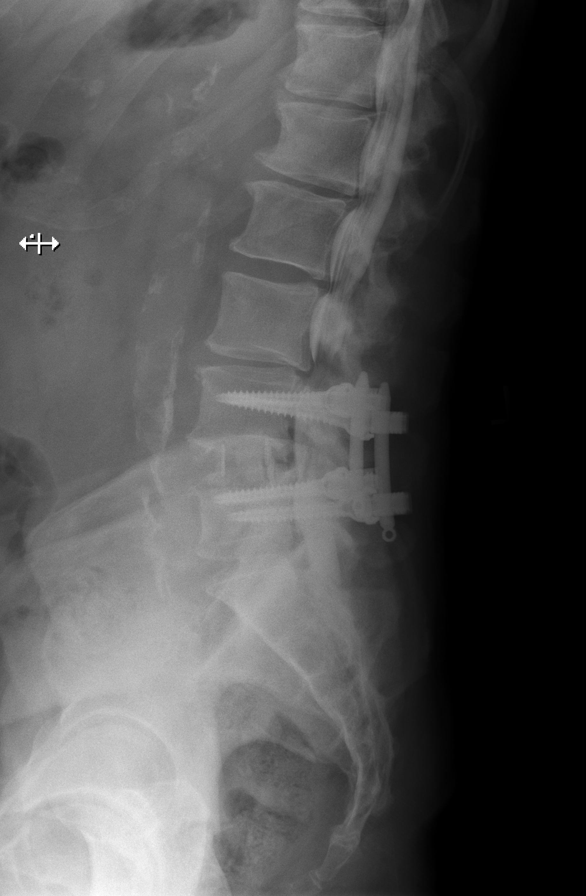

[Series 2: vasc adipose · 1 of 1 slices shown (2 of 3)]
[im 1/1]
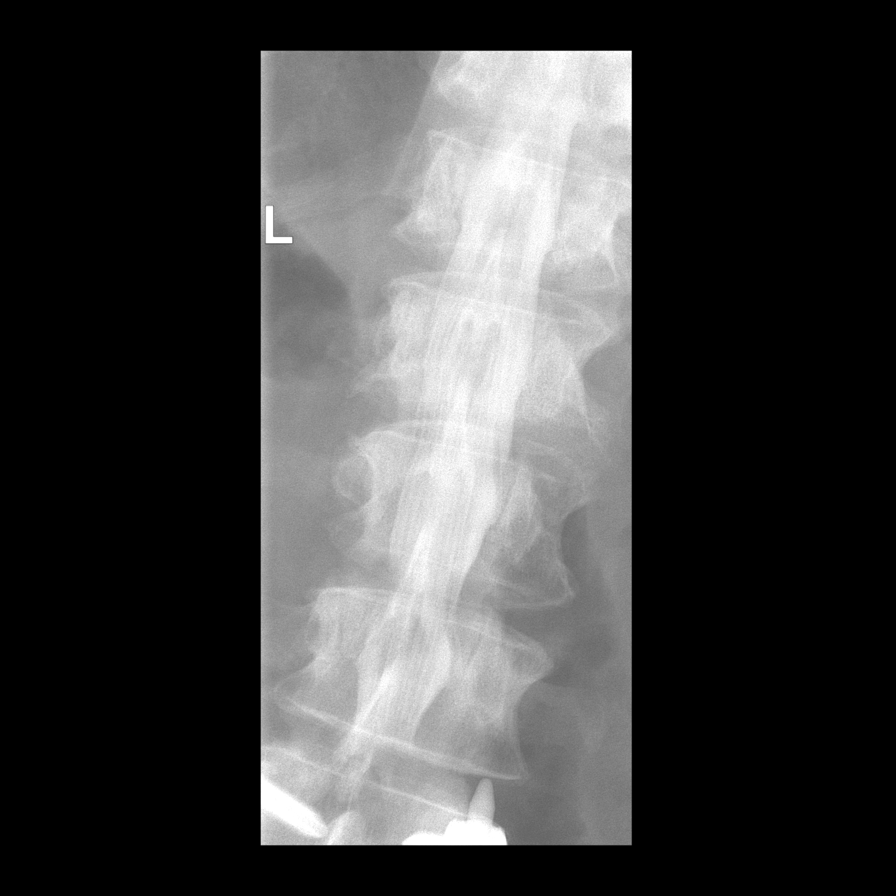

[Series 2: w lumbar spine flexion · 0.15mm/px · 1 of 1 slices shown]
[im 1/1]
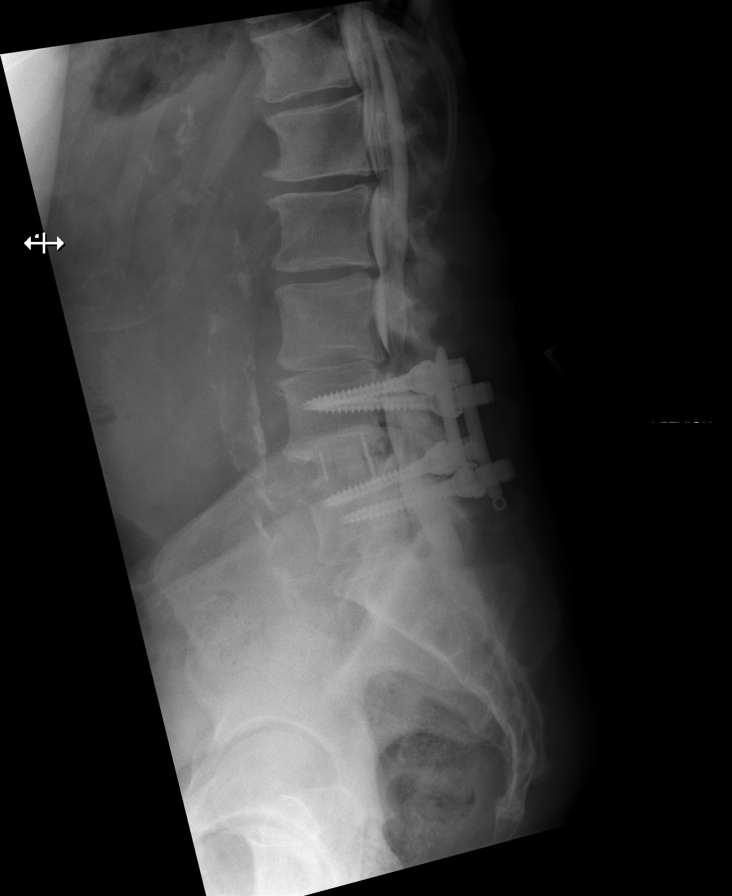

[Series 3: vasc adipose · 1 of 1 slices shown (3 of 3)]
[im 1/1]
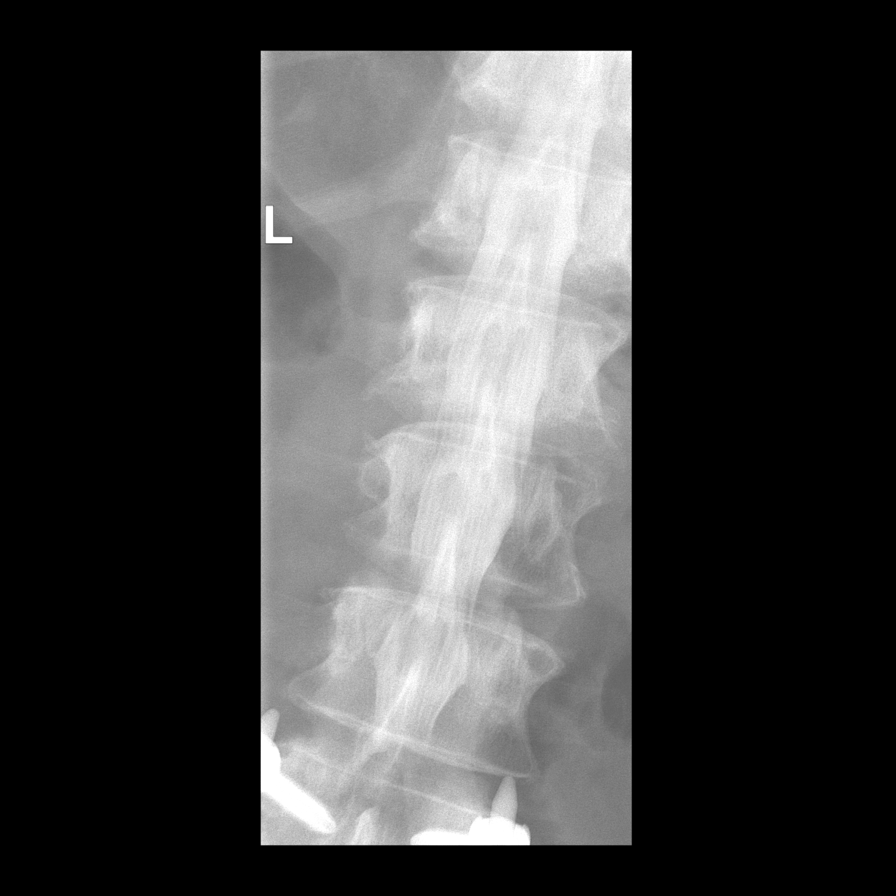

[Series 3: w lumbar spine extension · 0.15mm/px · 1 of 1 slices shown]
[im 1/1]
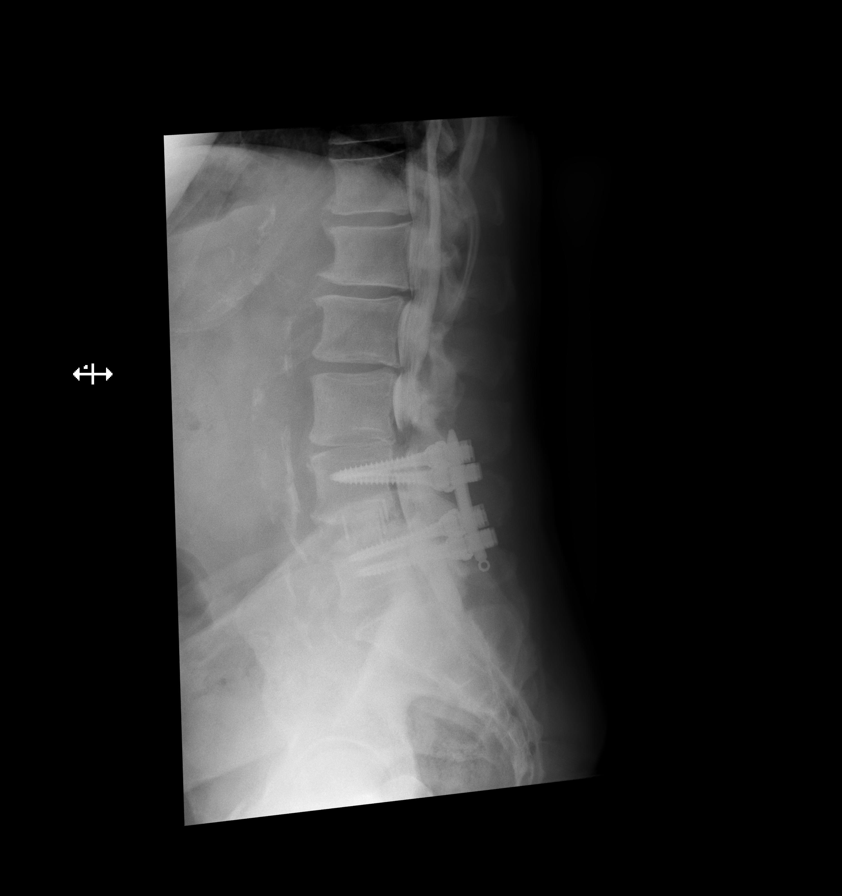

[6 of 11 positions shown; findings below may reference images not displayed]

EXAM:
LUMBAR MYELOGRAM

FLUOROSCOPY TIME:  0 minutes 54 seconds. 246.85 micro gray meter
squared

PROCEDURE:
After thorough discussion of risks and benefits of the procedure
including bleeding, infection, injury to nerves, blood vessels,
adjacent structures as well as headache and CSF leak, written and
oral informed consent was obtained. Consent was obtained by Dr. Ronlor
Dieu. Time out form was completed.

Patient was positioned prone on the fluoroscopy table. Local
anesthesia was provided with 1% lidocaine without epinephrine after
prepped and draped in the usual sterile fashion. Puncture was
performed at L2-3 using a 3 1/2 inch 22-gauge spinal needle via left
paramedian approach. Using a single pass through the dura, the
needle was placed within the thecal sac, with return of clear CSF.
12 cc of Isovue L-HQQ was injected into the thecal sac, with normal
opacification of the nerve roots and cauda equina consistent with
free flow within the subarachnoid space.

I personally performed the lumbar puncture and administered the
intrathecal contrast. I also personally performed acquisition of the
myelogram images.
FINDINGS: LUMBAR MYELOGRAM FINDINGS:

Disc degeneration with anterior extradural defects at T12-L1, L1-2,
L2-3 and L3-4. Severe multifactorial spinal stenosis at L3-4. Good
appearance at the fusion level of L4-5. No apparent compressive
stenosis at L5-S1. standing flexion extension views show the slight
rocking motion at L3-4.

CT LUMBAR MYELOGRAM FINDINGS:

T10-11 and T11-12. Anterior osteophytes. No canal or foraminal
stenosis.

T12-L1: Disc degeneration with vacuum phenomenon. Mild bulging of
the disc. No apparent compressive stenosis.

L1-2: Disc degeneration with vacuum phenomenon. Bulging of the disc.
Mild stenosis of both lateral recesses and foramina without definite
neural compression.

L2-3: Endplate osteophytes and bulging of the disc. Facet and
ligamentous hypertrophy. Moderate multifactorial stenosis with
potential for neural compression particularly in the lateral
recesses, left more than right.

L3-4: Circumferential disc protrusion. Facet and ligamentous
hypertrophy. Severe multifactorial stenosis with effacement of the
subarachnoid space. Neural compression could occur on either or both
sides.

L4-5: Distant PLIF. Solid union with wide patency of the canal and
foramina. No hardware complication evident.

L5-S1: Annular bulging with annular calcification. Bulging annulus
approaches the S1 nerves but does not appear to compress or displace
them. Foramina sufficiently patent.

Bilateral sacroiliac ankylosis.
IMPRESSION: L3-4: Severe multifactorial spinal stenosis because of
circumferential protrusion of the disc in combination with
pronounced facet and ligamentous hypertrophy. Neural compression
quite likely at this level.

L2-3: Moderate multifactorial spinal stenosis but without distinct
neural compression.

L1-2: Disc bulge. Mild lateral recess narrowing without visible
neural compression.

L4-5: Good appearance following distant PLIF.

L5-S1: Annular bulge with calcification. No apparent compressive
stenosis.

Bilateral sacroiliac ankylosis.

## 2019-12-19 DIAGNOSIS — D631 Anemia in chronic kidney disease: Secondary | ICD-10-CM | POA: Diagnosis not present

## 2019-12-19 DIAGNOSIS — R252 Cramp and spasm: Secondary | ICD-10-CM | POA: Diagnosis not present

## 2019-12-19 DIAGNOSIS — E1122 Type 2 diabetes mellitus with diabetic chronic kidney disease: Secondary | ICD-10-CM | POA: Diagnosis not present

## 2019-12-19 DIAGNOSIS — D582 Other hemoglobinopathies: Secondary | ICD-10-CM | POA: Diagnosis not present

## 2019-12-19 DIAGNOSIS — N189 Chronic kidney disease, unspecified: Secondary | ICD-10-CM | POA: Diagnosis not present

## 2019-12-19 DIAGNOSIS — N179 Acute kidney failure, unspecified: Secondary | ICD-10-CM | POA: Diagnosis not present

## 2019-12-19 DIAGNOSIS — N183 Chronic kidney disease, stage 3 unspecified: Secondary | ICD-10-CM | POA: Diagnosis not present

## 2019-12-19 DIAGNOSIS — Z794 Long term (current) use of insulin: Secondary | ICD-10-CM | POA: Diagnosis not present

## 2019-12-19 DIAGNOSIS — R6889 Other general symptoms and signs: Secondary | ICD-10-CM | POA: Diagnosis not present

## 2019-12-19 DIAGNOSIS — R404 Transient alteration of awareness: Secondary | ICD-10-CM | POA: Diagnosis not present

## 2019-12-19 DIAGNOSIS — Z952 Presence of prosthetic heart valve: Secondary | ICD-10-CM | POA: Diagnosis not present

## 2019-12-19 DIAGNOSIS — Z03818 Encounter for observation for suspected exposure to other biological agents ruled out: Secondary | ICD-10-CM | POA: Diagnosis not present

## 2019-12-19 DIAGNOSIS — Z743 Need for continuous supervision: Secondary | ICD-10-CM | POA: Diagnosis not present

## 2019-12-19 DIAGNOSIS — I5022 Chronic systolic (congestive) heart failure: Secondary | ICD-10-CM | POA: Diagnosis not present

## 2019-12-19 DIAGNOSIS — I130 Hypertensive heart and chronic kidney disease with heart failure and stage 1 through stage 4 chronic kidney disease, or unspecified chronic kidney disease: Secondary | ICD-10-CM | POA: Diagnosis not present

## 2019-12-19 DIAGNOSIS — R0689 Other abnormalities of breathing: Secondary | ICD-10-CM | POA: Diagnosis not present

## 2019-12-20 DIAGNOSIS — R252 Cramp and spasm: Secondary | ICD-10-CM | POA: Diagnosis not present

## 2019-12-20 DIAGNOSIS — N189 Chronic kidney disease, unspecified: Secondary | ICD-10-CM | POA: Diagnosis not present

## 2019-12-20 DIAGNOSIS — R531 Weakness: Secondary | ICD-10-CM | POA: Diagnosis not present

## 2019-12-20 DIAGNOSIS — D649 Anemia, unspecified: Secondary | ICD-10-CM | POA: Diagnosis not present

## 2019-12-20 DIAGNOSIS — I10 Essential (primary) hypertension: Secondary | ICD-10-CM | POA: Diagnosis not present

## 2019-12-20 DIAGNOSIS — N400 Enlarged prostate without lower urinary tract symptoms: Secondary | ICD-10-CM | POA: Diagnosis not present

## 2019-12-20 DIAGNOSIS — D509 Iron deficiency anemia, unspecified: Secondary | ICD-10-CM | POA: Diagnosis not present

## 2019-12-20 DIAGNOSIS — I5022 Chronic systolic (congestive) heart failure: Secondary | ICD-10-CM | POA: Diagnosis not present

## 2019-12-20 DIAGNOSIS — Z794 Long term (current) use of insulin: Secondary | ICD-10-CM | POA: Diagnosis not present

## 2019-12-20 DIAGNOSIS — N179 Acute kidney failure, unspecified: Secondary | ICD-10-CM | POA: Diagnosis not present

## 2019-12-20 DIAGNOSIS — E119 Type 2 diabetes mellitus without complications: Secondary | ICD-10-CM | POA: Diagnosis not present

## 2019-12-20 DIAGNOSIS — G8929 Other chronic pain: Secondary | ICD-10-CM | POA: Diagnosis not present

## 2019-12-20 DIAGNOSIS — D508 Other iron deficiency anemias: Secondary | ICD-10-CM | POA: Diagnosis not present

## 2019-12-21 DIAGNOSIS — Z952 Presence of prosthetic heart valve: Secondary | ICD-10-CM | POA: Diagnosis not present

## 2019-12-21 DIAGNOSIS — D582 Other hemoglobinopathies: Secondary | ICD-10-CM | POA: Diagnosis not present

## 2019-12-21 DIAGNOSIS — E119 Type 2 diabetes mellitus without complications: Secondary | ICD-10-CM | POA: Diagnosis not present

## 2019-12-21 DIAGNOSIS — Z794 Long term (current) use of insulin: Secondary | ICD-10-CM | POA: Diagnosis not present

## 2019-12-24 DIAGNOSIS — M461 Sacroiliitis, not elsewhere classified: Secondary | ICD-10-CM | POA: Diagnosis not present

## 2020-01-06 DIAGNOSIS — Z6830 Body mass index (BMI) 30.0-30.9, adult: Secondary | ICD-10-CM | POA: Diagnosis not present

## 2020-01-06 DIAGNOSIS — I50810 Right heart failure, unspecified: Secondary | ICD-10-CM | POA: Diagnosis not present

## 2020-01-06 DIAGNOSIS — E1165 Type 2 diabetes mellitus with hyperglycemia: Secondary | ICD-10-CM | POA: Diagnosis not present

## 2020-01-06 DIAGNOSIS — Z950 Presence of cardiac pacemaker: Secondary | ICD-10-CM | POA: Diagnosis not present

## 2020-01-06 DIAGNOSIS — K219 Gastro-esophageal reflux disease without esophagitis: Secondary | ICD-10-CM | POA: Diagnosis not present

## 2020-01-06 DIAGNOSIS — N189 Chronic kidney disease, unspecified: Secondary | ICD-10-CM | POA: Diagnosis not present

## 2020-01-06 DIAGNOSIS — I480 Paroxysmal atrial fibrillation: Secondary | ICD-10-CM | POA: Diagnosis not present

## 2020-01-06 DIAGNOSIS — I442 Atrioventricular block, complete: Secondary | ICD-10-CM | POA: Diagnosis not present

## 2020-01-06 DIAGNOSIS — E118 Type 2 diabetes mellitus with unspecified complications: Secondary | ICD-10-CM | POA: Diagnosis not present

## 2020-01-06 DIAGNOSIS — I10 Essential (primary) hypertension: Secondary | ICD-10-CM | POA: Diagnosis not present

## 2020-01-06 DIAGNOSIS — Z79899 Other long term (current) drug therapy: Secondary | ICD-10-CM | POA: Diagnosis not present

## 2020-01-06 DIAGNOSIS — I509 Heart failure, unspecified: Secondary | ICD-10-CM | POA: Diagnosis not present

## 2020-01-06 DIAGNOSIS — D649 Anemia, unspecified: Secondary | ICD-10-CM | POA: Diagnosis not present

## 2020-01-06 DIAGNOSIS — I428 Other cardiomyopathies: Secondary | ICD-10-CM | POA: Diagnosis not present

## 2020-01-08 DIAGNOSIS — N281 Cyst of kidney, acquired: Secondary | ICD-10-CM | POA: Diagnosis not present

## 2020-01-08 DIAGNOSIS — N400 Enlarged prostate without lower urinary tract symptoms: Secondary | ICD-10-CM | POA: Diagnosis not present

## 2020-01-08 DIAGNOSIS — N189 Chronic kidney disease, unspecified: Secondary | ICD-10-CM | POA: Diagnosis not present

## 2020-01-09 DIAGNOSIS — M545 Low back pain: Secondary | ICD-10-CM | POA: Diagnosis not present

## 2020-01-14 DIAGNOSIS — M545 Low back pain: Secondary | ICD-10-CM | POA: Diagnosis not present

## 2020-02-05 DIAGNOSIS — H11123 Conjunctival concretions, bilateral: Secondary | ICD-10-CM | POA: Diagnosis not present

## 2020-02-05 DIAGNOSIS — H1089 Other conjunctivitis: Secondary | ICD-10-CM | POA: Diagnosis not present

## 2020-02-06 DIAGNOSIS — E1165 Type 2 diabetes mellitus with hyperglycemia: Secondary | ICD-10-CM | POA: Diagnosis not present

## 2020-02-07 DIAGNOSIS — E1122 Type 2 diabetes mellitus with diabetic chronic kidney disease: Secondary | ICD-10-CM | POA: Diagnosis not present

## 2020-02-07 DIAGNOSIS — E1142 Type 2 diabetes mellitus with diabetic polyneuropathy: Secondary | ICD-10-CM | POA: Diagnosis not present

## 2020-02-07 DIAGNOSIS — E1151 Type 2 diabetes mellitus with diabetic peripheral angiopathy without gangrene: Secondary | ICD-10-CM | POA: Diagnosis not present

## 2020-02-07 DIAGNOSIS — I2510 Atherosclerotic heart disease of native coronary artery without angina pectoris: Secondary | ICD-10-CM | POA: Diagnosis not present

## 2020-02-07 DIAGNOSIS — Z794 Long term (current) use of insulin: Secondary | ICD-10-CM | POA: Diagnosis not present

## 2020-02-07 DIAGNOSIS — N1832 Chronic kidney disease, stage 3b: Secondary | ICD-10-CM | POA: Diagnosis not present

## 2020-02-13 DIAGNOSIS — Z23 Encounter for immunization: Secondary | ICD-10-CM | POA: Diagnosis not present

## 2020-02-13 DIAGNOSIS — G2581 Restless legs syndrome: Secondary | ICD-10-CM | POA: Diagnosis not present

## 2020-02-13 DIAGNOSIS — Z96659 Presence of unspecified artificial knee joint: Secondary | ICD-10-CM | POA: Diagnosis not present

## 2020-02-13 DIAGNOSIS — I50810 Right heart failure, unspecified: Secondary | ICD-10-CM | POA: Diagnosis not present

## 2020-02-13 DIAGNOSIS — E118 Type 2 diabetes mellitus with unspecified complications: Secondary | ICD-10-CM | POA: Diagnosis not present

## 2020-02-13 DIAGNOSIS — T8459XS Infection and inflammatory reaction due to other internal joint prosthesis, sequela: Secondary | ICD-10-CM | POA: Diagnosis not present

## 2020-02-13 DIAGNOSIS — I10 Essential (primary) hypertension: Secondary | ICD-10-CM | POA: Diagnosis not present

## 2020-02-13 DIAGNOSIS — Z952 Presence of prosthetic heart valve: Secondary | ICD-10-CM | POA: Diagnosis not present

## 2020-02-13 DIAGNOSIS — Z6830 Body mass index (BMI) 30.0-30.9, adult: Secondary | ICD-10-CM | POA: Diagnosis not present

## 2020-02-20 DIAGNOSIS — M5442 Lumbago with sciatica, left side: Secondary | ICD-10-CM | POA: Diagnosis not present

## 2020-02-27 DIAGNOSIS — M5459 Other low back pain: Secondary | ICD-10-CM | POA: Diagnosis not present

## 2020-02-27 DIAGNOSIS — L97919 Non-pressure chronic ulcer of unspecified part of right lower leg with unspecified severity: Secondary | ICD-10-CM | POA: Diagnosis not present

## 2020-02-27 DIAGNOSIS — L97929 Non-pressure chronic ulcer of unspecified part of left lower leg with unspecified severity: Secondary | ICD-10-CM | POA: Diagnosis not present

## 2020-02-27 DIAGNOSIS — Z6830 Body mass index (BMI) 30.0-30.9, adult: Secondary | ICD-10-CM | POA: Diagnosis not present

## 2020-02-27 DIAGNOSIS — R600 Localized edema: Secondary | ICD-10-CM | POA: Diagnosis not present

## 2020-02-28 DIAGNOSIS — Z950 Presence of cardiac pacemaker: Secondary | ICD-10-CM | POA: Diagnosis not present

## 2020-02-28 DIAGNOSIS — E1165 Type 2 diabetes mellitus with hyperglycemia: Secondary | ICD-10-CM | POA: Diagnosis not present

## 2020-02-28 DIAGNOSIS — R0602 Shortness of breath: Secondary | ICD-10-CM | POA: Diagnosis not present

## 2020-02-28 DIAGNOSIS — Z794 Long term (current) use of insulin: Secondary | ICD-10-CM | POA: Diagnosis not present

## 2020-02-28 DIAGNOSIS — S81802A Unspecified open wound, left lower leg, initial encounter: Secondary | ICD-10-CM | POA: Diagnosis not present

## 2020-02-28 DIAGNOSIS — D631 Anemia in chronic kidney disease: Secondary | ICD-10-CM | POA: Diagnosis not present

## 2020-02-28 DIAGNOSIS — M25552 Pain in left hip: Secondary | ICD-10-CM | POA: Diagnosis not present

## 2020-02-28 DIAGNOSIS — I2510 Atherosclerotic heart disease of native coronary artery without angina pectoris: Secondary | ICD-10-CM | POA: Diagnosis not present

## 2020-02-28 DIAGNOSIS — E1122 Type 2 diabetes mellitus with diabetic chronic kidney disease: Secondary | ICD-10-CM | POA: Diagnosis not present

## 2020-02-28 DIAGNOSIS — I10 Essential (primary) hypertension: Secondary | ICD-10-CM | POA: Diagnosis not present

## 2020-02-28 DIAGNOSIS — G8929 Other chronic pain: Secondary | ICD-10-CM | POA: Diagnosis not present

## 2020-02-28 DIAGNOSIS — R079 Chest pain, unspecified: Secondary | ICD-10-CM | POA: Diagnosis not present

## 2020-02-28 DIAGNOSIS — R9431 Abnormal electrocardiogram [ECG] [EKG]: Secondary | ICD-10-CM | POA: Diagnosis not present

## 2020-02-28 DIAGNOSIS — R609 Edema, unspecified: Secondary | ICD-10-CM | POA: Diagnosis not present

## 2020-02-28 DIAGNOSIS — Z03818 Encounter for observation for suspected exposure to other biological agents ruled out: Secondary | ICD-10-CM | POA: Diagnosis not present

## 2020-02-28 DIAGNOSIS — M160 Bilateral primary osteoarthritis of hip: Secondary | ICD-10-CM | POA: Diagnosis not present

## 2020-02-28 DIAGNOSIS — I5033 Acute on chronic diastolic (congestive) heart failure: Secondary | ICD-10-CM | POA: Diagnosis not present

## 2020-02-28 DIAGNOSIS — E785 Hyperlipidemia, unspecified: Secondary | ICD-10-CM | POA: Diagnosis not present

## 2020-02-28 DIAGNOSIS — R0789 Other chest pain: Secondary | ICD-10-CM | POA: Diagnosis not present

## 2020-02-28 DIAGNOSIS — N183 Chronic kidney disease, stage 3 unspecified: Secondary | ICD-10-CM | POA: Diagnosis not present

## 2020-02-29 DIAGNOSIS — I5033 Acute on chronic diastolic (congestive) heart failure: Secondary | ICD-10-CM | POA: Diagnosis not present

## 2020-02-29 DIAGNOSIS — N183 Chronic kidney disease, stage 3 unspecified: Secondary | ICD-10-CM | POA: Diagnosis not present

## 2020-02-29 DIAGNOSIS — R079 Chest pain, unspecified: Secondary | ICD-10-CM | POA: Diagnosis not present

## 2020-02-29 DIAGNOSIS — I517 Cardiomegaly: Secondary | ICD-10-CM | POA: Diagnosis not present

## 2020-02-29 DIAGNOSIS — I361 Nonrheumatic tricuspid (valve) insufficiency: Secondary | ICD-10-CM | POA: Diagnosis not present

## 2020-03-01 DIAGNOSIS — D631 Anemia in chronic kidney disease: Secondary | ICD-10-CM | POA: Diagnosis not present

## 2020-03-01 DIAGNOSIS — I5033 Acute on chronic diastolic (congestive) heart failure: Secondary | ICD-10-CM | POA: Diagnosis not present

## 2020-03-01 DIAGNOSIS — E1122 Type 2 diabetes mellitus with diabetic chronic kidney disease: Secondary | ICD-10-CM | POA: Diagnosis not present

## 2020-03-01 DIAGNOSIS — S81802A Unspecified open wound, left lower leg, initial encounter: Secondary | ICD-10-CM | POA: Diagnosis not present

## 2020-03-01 DIAGNOSIS — Z794 Long term (current) use of insulin: Secondary | ICD-10-CM | POA: Diagnosis not present

## 2020-03-01 DIAGNOSIS — I10 Essential (primary) hypertension: Secondary | ICD-10-CM | POA: Diagnosis not present

## 2020-03-01 DIAGNOSIS — S81809A Unspecified open wound, unspecified lower leg, initial encounter: Secondary | ICD-10-CM | POA: Diagnosis not present

## 2020-03-01 DIAGNOSIS — N183 Chronic kidney disease, stage 3 unspecified: Secondary | ICD-10-CM | POA: Diagnosis not present

## 2020-03-02 DIAGNOSIS — D631 Anemia in chronic kidney disease: Secondary | ICD-10-CM | POA: Diagnosis not present

## 2020-03-02 DIAGNOSIS — I10 Essential (primary) hypertension: Secondary | ICD-10-CM | POA: Diagnosis not present

## 2020-03-02 DIAGNOSIS — N183 Chronic kidney disease, stage 3 unspecified: Secondary | ICD-10-CM | POA: Diagnosis not present

## 2020-03-02 DIAGNOSIS — S81802A Unspecified open wound, left lower leg, initial encounter: Secondary | ICD-10-CM | POA: Diagnosis not present

## 2020-03-02 DIAGNOSIS — Z794 Long term (current) use of insulin: Secondary | ICD-10-CM | POA: Diagnosis not present

## 2020-03-02 DIAGNOSIS — E1122 Type 2 diabetes mellitus with diabetic chronic kidney disease: Secondary | ICD-10-CM | POA: Diagnosis not present

## 2020-03-02 DIAGNOSIS — I70248 Atherosclerosis of native arteries of left leg with ulceration of other part of lower left leg: Secondary | ICD-10-CM | POA: Diagnosis not present

## 2020-03-02 DIAGNOSIS — I5033 Acute on chronic diastolic (congestive) heart failure: Secondary | ICD-10-CM | POA: Diagnosis not present

## 2020-03-03 DIAGNOSIS — S81802A Unspecified open wound, left lower leg, initial encounter: Secondary | ICD-10-CM | POA: Diagnosis not present

## 2020-03-03 DIAGNOSIS — D631 Anemia in chronic kidney disease: Secondary | ICD-10-CM | POA: Diagnosis not present

## 2020-03-03 DIAGNOSIS — I5033 Acute on chronic diastolic (congestive) heart failure: Secondary | ICD-10-CM | POA: Diagnosis not present

## 2020-03-03 DIAGNOSIS — N183 Chronic kidney disease, stage 3 unspecified: Secondary | ICD-10-CM | POA: Diagnosis not present

## 2020-03-03 DIAGNOSIS — Z794 Long term (current) use of insulin: Secondary | ICD-10-CM | POA: Diagnosis not present

## 2020-03-03 DIAGNOSIS — I10 Essential (primary) hypertension: Secondary | ICD-10-CM | POA: Diagnosis not present

## 2020-03-03 DIAGNOSIS — E1122 Type 2 diabetes mellitus with diabetic chronic kidney disease: Secondary | ICD-10-CM | POA: Diagnosis not present

## 2020-03-04 DIAGNOSIS — N183 Chronic kidney disease, stage 3 unspecified: Secondary | ICD-10-CM | POA: Diagnosis not present

## 2020-03-04 DIAGNOSIS — S81802A Unspecified open wound, left lower leg, initial encounter: Secondary | ICD-10-CM | POA: Diagnosis not present

## 2020-03-04 DIAGNOSIS — I5023 Acute on chronic systolic (congestive) heart failure: Secondary | ICD-10-CM | POA: Diagnosis not present

## 2020-03-05 DIAGNOSIS — I5023 Acute on chronic systolic (congestive) heart failure: Secondary | ICD-10-CM | POA: Diagnosis not present

## 2020-03-05 DIAGNOSIS — S81802A Unspecified open wound, left lower leg, initial encounter: Secondary | ICD-10-CM | POA: Diagnosis not present

## 2020-03-05 DIAGNOSIS — N183 Chronic kidney disease, stage 3 unspecified: Secondary | ICD-10-CM | POA: Diagnosis not present

## 2020-03-06 DIAGNOSIS — Z20822 Contact with and (suspected) exposure to covid-19: Secondary | ICD-10-CM | POA: Diagnosis not present

## 2020-03-06 DIAGNOSIS — I5033 Acute on chronic diastolic (congestive) heart failure: Secondary | ICD-10-CM | POA: Diagnosis not present

## 2020-03-06 DIAGNOSIS — I5022 Chronic systolic (congestive) heart failure: Secondary | ICD-10-CM | POA: Diagnosis not present

## 2020-03-06 DIAGNOSIS — B952 Enterococcus as the cause of diseases classified elsewhere: Secondary | ICD-10-CM | POA: Diagnosis not present

## 2020-03-06 DIAGNOSIS — Z4781 Encounter for orthopedic aftercare following surgical amputation: Secondary | ICD-10-CM | POA: Diagnosis not present

## 2020-03-06 DIAGNOSIS — Z452 Encounter for adjustment and management of vascular access device: Secondary | ICD-10-CM | POA: Diagnosis not present

## 2020-03-06 DIAGNOSIS — I96 Gangrene, not elsewhere classified: Secondary | ICD-10-CM | POA: Diagnosis not present

## 2020-03-06 DIAGNOSIS — I10 Essential (primary) hypertension: Secondary | ICD-10-CM | POA: Diagnosis not present

## 2020-03-06 DIAGNOSIS — T8454XA Infection and inflammatory reaction due to internal left knee prosthesis, initial encounter: Secondary | ICD-10-CM | POA: Diagnosis not present

## 2020-03-06 DIAGNOSIS — I7389 Other specified peripheral vascular diseases: Secondary | ICD-10-CM | POA: Diagnosis not present

## 2020-03-06 DIAGNOSIS — E1152 Type 2 diabetes mellitus with diabetic peripheral angiopathy with gangrene: Secondary | ICD-10-CM | POA: Diagnosis not present

## 2020-03-06 DIAGNOSIS — D62 Acute posthemorrhagic anemia: Secondary | ICD-10-CM | POA: Diagnosis not present

## 2020-03-06 DIAGNOSIS — Z89422 Acquired absence of other left toe(s): Secondary | ICD-10-CM | POA: Diagnosis not present

## 2020-03-06 DIAGNOSIS — R7881 Bacteremia: Secondary | ICD-10-CM | POA: Diagnosis not present

## 2020-03-06 DIAGNOSIS — I70248 Atherosclerosis of native arteries of left leg with ulceration of other part of lower left leg: Secondary | ICD-10-CM | POA: Diagnosis not present

## 2020-03-06 DIAGNOSIS — L89626 Pressure-induced deep tissue damage of left heel: Secondary | ICD-10-CM | POA: Diagnosis not present

## 2020-03-06 DIAGNOSIS — M009 Pyogenic arthritis, unspecified: Secondary | ICD-10-CM | POA: Diagnosis not present

## 2020-03-06 DIAGNOSIS — N1832 Chronic kidney disease, stage 3b: Secondary | ICD-10-CM | POA: Diagnosis not present

## 2020-03-06 DIAGNOSIS — Z950 Presence of cardiac pacemaker: Secondary | ICD-10-CM | POA: Diagnosis not present

## 2020-03-06 DIAGNOSIS — R509 Fever, unspecified: Secondary | ICD-10-CM | POA: Diagnosis not present

## 2020-03-06 DIAGNOSIS — E1142 Type 2 diabetes mellitus with diabetic polyneuropathy: Secondary | ICD-10-CM | POA: Diagnosis not present

## 2020-03-06 DIAGNOSIS — N183 Chronic kidney disease, stage 3 unspecified: Secondary | ICD-10-CM | POA: Diagnosis not present

## 2020-03-06 DIAGNOSIS — L97529 Non-pressure chronic ulcer of other part of left foot with unspecified severity: Secondary | ICD-10-CM | POA: Diagnosis not present

## 2020-03-06 DIAGNOSIS — T8454XS Infection and inflammatory reaction due to internal left knee prosthesis, sequela: Secondary | ICD-10-CM | POA: Diagnosis not present

## 2020-03-06 DIAGNOSIS — G8918 Other acute postprocedural pain: Secondary | ICD-10-CM | POA: Diagnosis not present

## 2020-03-06 DIAGNOSIS — E1122 Type 2 diabetes mellitus with diabetic chronic kidney disease: Secondary | ICD-10-CM | POA: Diagnosis not present

## 2020-03-06 DIAGNOSIS — I5043 Acute on chronic combined systolic (congestive) and diastolic (congestive) heart failure: Secondary | ICD-10-CM | POA: Diagnosis not present

## 2020-03-06 DIAGNOSIS — L97929 Non-pressure chronic ulcer of unspecified part of left lower leg with unspecified severity: Secondary | ICD-10-CM | POA: Diagnosis not present

## 2020-03-06 DIAGNOSIS — I340 Nonrheumatic mitral (valve) insufficiency: Secondary | ICD-10-CM | POA: Diagnosis not present

## 2020-03-06 DIAGNOSIS — S81802A Unspecified open wound, left lower leg, initial encounter: Secondary | ICD-10-CM | POA: Diagnosis not present

## 2020-03-06 DIAGNOSIS — E1165 Type 2 diabetes mellitus with hyperglycemia: Secondary | ICD-10-CM | POA: Diagnosis not present

## 2020-03-06 DIAGNOSIS — I739 Peripheral vascular disease, unspecified: Secondary | ICD-10-CM | POA: Diagnosis not present

## 2020-03-06 DIAGNOSIS — I5023 Acute on chronic systolic (congestive) heart failure: Secondary | ICD-10-CM | POA: Diagnosis not present

## 2020-03-06 DIAGNOSIS — E119 Type 2 diabetes mellitus without complications: Secondary | ICD-10-CM | POA: Diagnosis not present

## 2020-03-06 DIAGNOSIS — I130 Hypertensive heart and chronic kidney disease with heart failure and stage 1 through stage 4 chronic kidney disease, or unspecified chronic kidney disease: Secondary | ICD-10-CM | POA: Diagnosis not present

## 2020-03-07 DIAGNOSIS — S81802A Unspecified open wound, left lower leg, initial encounter: Secondary | ICD-10-CM | POA: Diagnosis not present

## 2020-03-07 DIAGNOSIS — N183 Chronic kidney disease, stage 3 unspecified: Secondary | ICD-10-CM | POA: Diagnosis not present

## 2020-03-07 DIAGNOSIS — I5023 Acute on chronic systolic (congestive) heart failure: Secondary | ICD-10-CM | POA: Diagnosis not present

## 2020-03-08 DIAGNOSIS — N183 Chronic kidney disease, stage 3 unspecified: Secondary | ICD-10-CM | POA: Diagnosis not present

## 2020-03-08 DIAGNOSIS — S81802A Unspecified open wound, left lower leg, initial encounter: Secondary | ICD-10-CM | POA: Diagnosis not present

## 2020-03-08 DIAGNOSIS — I5023 Acute on chronic systolic (congestive) heart failure: Secondary | ICD-10-CM | POA: Diagnosis not present

## 2020-03-09 DIAGNOSIS — Z950 Presence of cardiac pacemaker: Secondary | ICD-10-CM | POA: Diagnosis not present

## 2020-03-09 DIAGNOSIS — R509 Fever, unspecified: Secondary | ICD-10-CM | POA: Diagnosis not present

## 2020-03-16 DIAGNOSIS — Z89422 Acquired absence of other left toe(s): Secondary | ICD-10-CM | POA: Diagnosis not present

## 2020-03-16 DIAGNOSIS — Z452 Encounter for adjustment and management of vascular access device: Secondary | ICD-10-CM | POA: Diagnosis not present

## 2020-03-16 DIAGNOSIS — N1832 Chronic kidney disease, stage 3b: Secondary | ICD-10-CM | POA: Diagnosis not present

## 2020-03-16 DIAGNOSIS — E1122 Type 2 diabetes mellitus with diabetic chronic kidney disease: Secondary | ICD-10-CM | POA: Diagnosis not present

## 2020-03-16 DIAGNOSIS — I70248 Atherosclerosis of native arteries of left leg with ulceration of other part of lower left leg: Secondary | ICD-10-CM | POA: Diagnosis not present

## 2020-03-16 DIAGNOSIS — T8131XA Disruption of external operation (surgical) wound, not elsewhere classified, initial encounter: Secondary | ICD-10-CM | POA: Diagnosis not present

## 2020-03-16 DIAGNOSIS — N183 Chronic kidney disease, stage 3 unspecified: Secondary | ICD-10-CM | POA: Diagnosis not present

## 2020-03-16 DIAGNOSIS — I130 Hypertensive heart and chronic kidney disease with heart failure and stage 1 through stage 4 chronic kidney disease, or unspecified chronic kidney disease: Secondary | ICD-10-CM | POA: Diagnosis not present

## 2020-03-16 DIAGNOSIS — D62 Acute posthemorrhagic anemia: Secondary | ICD-10-CM | POA: Diagnosis not present

## 2020-03-16 DIAGNOSIS — E785 Hyperlipidemia, unspecified: Secondary | ICD-10-CM | POA: Diagnosis not present

## 2020-03-16 DIAGNOSIS — Z7189 Other specified counseling: Secondary | ICD-10-CM | POA: Diagnosis not present

## 2020-03-16 DIAGNOSIS — I96 Gangrene, not elsewhere classified: Secondary | ICD-10-CM | POA: Diagnosis not present

## 2020-03-16 DIAGNOSIS — R7881 Bacteremia: Secondary | ICD-10-CM | POA: Diagnosis not present

## 2020-03-16 DIAGNOSIS — Z20822 Contact with and (suspected) exposure to covid-19: Secondary | ICD-10-CM | POA: Diagnosis not present

## 2020-03-16 DIAGNOSIS — Z794 Long term (current) use of insulin: Secondary | ICD-10-CM | POA: Diagnosis not present

## 2020-03-16 DIAGNOSIS — E11622 Type 2 diabetes mellitus with other skin ulcer: Secondary | ICD-10-CM | POA: Diagnosis not present

## 2020-03-16 DIAGNOSIS — S81802A Unspecified open wound, left lower leg, initial encounter: Secondary | ICD-10-CM | POA: Diagnosis not present

## 2020-03-16 DIAGNOSIS — L89520 Pressure ulcer of left ankle, unstageable: Secondary | ICD-10-CM | POA: Diagnosis not present

## 2020-03-16 DIAGNOSIS — Z4781 Encounter for orthopedic aftercare following surgical amputation: Secondary | ICD-10-CM | POA: Diagnosis not present

## 2020-03-16 DIAGNOSIS — L97822 Non-pressure chronic ulcer of other part of left lower leg with fat layer exposed: Secondary | ICD-10-CM | POA: Diagnosis not present

## 2020-03-16 DIAGNOSIS — R5381 Other malaise: Secondary | ICD-10-CM | POA: Diagnosis not present

## 2020-03-16 DIAGNOSIS — B952 Enterococcus as the cause of diseases classified elsewhere: Secondary | ICD-10-CM | POA: Diagnosis not present

## 2020-03-16 DIAGNOSIS — L89620 Pressure ulcer of left heel, unstageable: Secondary | ICD-10-CM | POA: Diagnosis not present

## 2020-03-16 DIAGNOSIS — M25552 Pain in left hip: Secondary | ICD-10-CM | POA: Diagnosis not present

## 2020-03-16 DIAGNOSIS — E1159 Type 2 diabetes mellitus with other circulatory complications: Secondary | ICD-10-CM | POA: Diagnosis not present

## 2020-03-16 DIAGNOSIS — E1151 Type 2 diabetes mellitus with diabetic peripheral angiopathy without gangrene: Secondary | ICD-10-CM | POA: Diagnosis not present

## 2020-03-16 DIAGNOSIS — I7025 Atherosclerosis of native arteries of other extremities with ulceration: Secondary | ICD-10-CM | POA: Diagnosis not present

## 2020-03-16 DIAGNOSIS — E1142 Type 2 diabetes mellitus with diabetic polyneuropathy: Secondary | ICD-10-CM | POA: Diagnosis not present

## 2020-03-16 DIAGNOSIS — I739 Peripheral vascular disease, unspecified: Secondary | ICD-10-CM | POA: Diagnosis not present

## 2020-03-16 DIAGNOSIS — T8454XA Infection and inflammatory reaction due to internal left knee prosthesis, initial encounter: Secondary | ICD-10-CM | POA: Diagnosis not present

## 2020-03-16 DIAGNOSIS — I5022 Chronic systolic (congestive) heart failure: Secondary | ICD-10-CM | POA: Diagnosis not present

## 2020-03-16 DIAGNOSIS — I2510 Atherosclerotic heart disease of native coronary artery without angina pectoris: Secondary | ICD-10-CM | POA: Diagnosis not present

## 2020-03-16 DIAGNOSIS — I10 Essential (primary) hypertension: Secondary | ICD-10-CM | POA: Diagnosis not present

## 2020-03-16 DIAGNOSIS — I5033 Acute on chronic diastolic (congestive) heart failure: Secondary | ICD-10-CM | POA: Diagnosis not present

## 2020-03-16 DIAGNOSIS — E1152 Type 2 diabetes mellitus with diabetic peripheral angiopathy with gangrene: Secondary | ICD-10-CM | POA: Diagnosis not present

## 2020-03-16 DIAGNOSIS — I5032 Chronic diastolic (congestive) heart failure: Secondary | ICD-10-CM | POA: Diagnosis not present

## 2020-03-17 DIAGNOSIS — Z794 Long term (current) use of insulin: Secondary | ICD-10-CM | POA: Diagnosis not present

## 2020-03-17 DIAGNOSIS — R7881 Bacteremia: Secondary | ICD-10-CM | POA: Diagnosis not present

## 2020-03-17 DIAGNOSIS — E785 Hyperlipidemia, unspecified: Secondary | ICD-10-CM | POA: Diagnosis not present

## 2020-03-17 DIAGNOSIS — N183 Chronic kidney disease, stage 3 unspecified: Secondary | ICD-10-CM | POA: Diagnosis not present

## 2020-03-17 DIAGNOSIS — R5381 Other malaise: Secondary | ICD-10-CM | POA: Diagnosis not present

## 2020-03-17 DIAGNOSIS — M25552 Pain in left hip: Secondary | ICD-10-CM | POA: Diagnosis not present

## 2020-03-17 DIAGNOSIS — I2510 Atherosclerotic heart disease of native coronary artery without angina pectoris: Secondary | ICD-10-CM | POA: Diagnosis not present

## 2020-03-17 DIAGNOSIS — Z7189 Other specified counseling: Secondary | ICD-10-CM | POA: Diagnosis not present

## 2020-03-17 DIAGNOSIS — B952 Enterococcus as the cause of diseases classified elsewhere: Secondary | ICD-10-CM | POA: Diagnosis not present

## 2020-03-17 DIAGNOSIS — E1151 Type 2 diabetes mellitus with diabetic peripheral angiopathy without gangrene: Secondary | ICD-10-CM | POA: Diagnosis not present

## 2020-03-18 DIAGNOSIS — E785 Hyperlipidemia, unspecified: Secondary | ICD-10-CM | POA: Diagnosis not present

## 2020-03-18 DIAGNOSIS — Z794 Long term (current) use of insulin: Secondary | ICD-10-CM | POA: Diagnosis not present

## 2020-03-18 DIAGNOSIS — N183 Chronic kidney disease, stage 3 unspecified: Secondary | ICD-10-CM | POA: Diagnosis not present

## 2020-03-18 DIAGNOSIS — E1151 Type 2 diabetes mellitus with diabetic peripheral angiopathy without gangrene: Secondary | ICD-10-CM | POA: Diagnosis not present

## 2020-03-18 DIAGNOSIS — I2510 Atherosclerotic heart disease of native coronary artery without angina pectoris: Secondary | ICD-10-CM | POA: Diagnosis not present

## 2020-03-18 DIAGNOSIS — M25552 Pain in left hip: Secondary | ICD-10-CM | POA: Diagnosis not present

## 2020-03-18 DIAGNOSIS — R5381 Other malaise: Secondary | ICD-10-CM | POA: Diagnosis not present

## 2020-03-18 DIAGNOSIS — B952 Enterococcus as the cause of diseases classified elsewhere: Secondary | ICD-10-CM | POA: Diagnosis not present

## 2020-03-18 DIAGNOSIS — R7881 Bacteremia: Secondary | ICD-10-CM | POA: Diagnosis not present

## 2020-03-19 DIAGNOSIS — I2510 Atherosclerotic heart disease of native coronary artery without angina pectoris: Secondary | ICD-10-CM | POA: Diagnosis not present

## 2020-03-19 DIAGNOSIS — R5381 Other malaise: Secondary | ICD-10-CM | POA: Diagnosis not present

## 2020-03-19 DIAGNOSIS — R7881 Bacteremia: Secondary | ICD-10-CM | POA: Diagnosis not present

## 2020-03-19 DIAGNOSIS — Z794 Long term (current) use of insulin: Secondary | ICD-10-CM | POA: Diagnosis not present

## 2020-03-19 DIAGNOSIS — E1151 Type 2 diabetes mellitus with diabetic peripheral angiopathy without gangrene: Secondary | ICD-10-CM | POA: Diagnosis not present

## 2020-03-19 DIAGNOSIS — E785 Hyperlipidemia, unspecified: Secondary | ICD-10-CM | POA: Diagnosis not present

## 2020-03-19 DIAGNOSIS — N183 Chronic kidney disease, stage 3 unspecified: Secondary | ICD-10-CM | POA: Diagnosis not present

## 2020-03-19 DIAGNOSIS — B952 Enterococcus as the cause of diseases classified elsewhere: Secondary | ICD-10-CM | POA: Diagnosis not present

## 2020-03-19 DIAGNOSIS — M25552 Pain in left hip: Secondary | ICD-10-CM | POA: Diagnosis not present

## 2020-03-20 DIAGNOSIS — M25552 Pain in left hip: Secondary | ICD-10-CM | POA: Diagnosis not present

## 2020-03-20 DIAGNOSIS — B952 Enterococcus as the cause of diseases classified elsewhere: Secondary | ICD-10-CM | POA: Diagnosis not present

## 2020-03-20 DIAGNOSIS — Z794 Long term (current) use of insulin: Secondary | ICD-10-CM | POA: Diagnosis not present

## 2020-03-20 DIAGNOSIS — N183 Chronic kidney disease, stage 3 unspecified: Secondary | ICD-10-CM | POA: Diagnosis not present

## 2020-03-20 DIAGNOSIS — E785 Hyperlipidemia, unspecified: Secondary | ICD-10-CM | POA: Diagnosis not present

## 2020-03-20 DIAGNOSIS — E1151 Type 2 diabetes mellitus with diabetic peripheral angiopathy without gangrene: Secondary | ICD-10-CM | POA: Diagnosis not present

## 2020-03-20 DIAGNOSIS — R5381 Other malaise: Secondary | ICD-10-CM | POA: Diagnosis not present

## 2020-03-20 DIAGNOSIS — R7881 Bacteremia: Secondary | ICD-10-CM | POA: Diagnosis not present

## 2020-03-20 DIAGNOSIS — I2510 Atherosclerotic heart disease of native coronary artery without angina pectoris: Secondary | ICD-10-CM | POA: Diagnosis not present

## 2020-03-21 DIAGNOSIS — R5381 Other malaise: Secondary | ICD-10-CM | POA: Diagnosis not present

## 2020-03-21 DIAGNOSIS — Z794 Long term (current) use of insulin: Secondary | ICD-10-CM | POA: Diagnosis not present

## 2020-03-21 DIAGNOSIS — R7881 Bacteremia: Secondary | ICD-10-CM | POA: Diagnosis not present

## 2020-03-21 DIAGNOSIS — I2510 Atherosclerotic heart disease of native coronary artery without angina pectoris: Secondary | ICD-10-CM | POA: Diagnosis not present

## 2020-03-21 DIAGNOSIS — E1151 Type 2 diabetes mellitus with diabetic peripheral angiopathy without gangrene: Secondary | ICD-10-CM | POA: Diagnosis not present

## 2020-03-21 DIAGNOSIS — E785 Hyperlipidemia, unspecified: Secondary | ICD-10-CM | POA: Diagnosis not present

## 2020-03-21 DIAGNOSIS — M25552 Pain in left hip: Secondary | ICD-10-CM | POA: Diagnosis not present

## 2020-03-21 DIAGNOSIS — N183 Chronic kidney disease, stage 3 unspecified: Secondary | ICD-10-CM | POA: Diagnosis not present

## 2020-03-21 DIAGNOSIS — B952 Enterococcus as the cause of diseases classified elsewhere: Secondary | ICD-10-CM | POA: Diagnosis not present

## 2020-03-22 DIAGNOSIS — E785 Hyperlipidemia, unspecified: Secondary | ICD-10-CM | POA: Diagnosis not present

## 2020-03-22 DIAGNOSIS — Z794 Long term (current) use of insulin: Secondary | ICD-10-CM | POA: Diagnosis not present

## 2020-03-22 DIAGNOSIS — I2510 Atherosclerotic heart disease of native coronary artery without angina pectoris: Secondary | ICD-10-CM | POA: Diagnosis not present

## 2020-03-22 DIAGNOSIS — M25552 Pain in left hip: Secondary | ICD-10-CM | POA: Diagnosis not present

## 2020-03-22 DIAGNOSIS — B952 Enterococcus as the cause of diseases classified elsewhere: Secondary | ICD-10-CM | POA: Diagnosis not present

## 2020-03-22 DIAGNOSIS — E1151 Type 2 diabetes mellitus with diabetic peripheral angiopathy without gangrene: Secondary | ICD-10-CM | POA: Diagnosis not present

## 2020-03-22 DIAGNOSIS — R5381 Other malaise: Secondary | ICD-10-CM | POA: Diagnosis not present

## 2020-03-22 DIAGNOSIS — N183 Chronic kidney disease, stage 3 unspecified: Secondary | ICD-10-CM | POA: Diagnosis not present

## 2020-03-22 DIAGNOSIS — R7881 Bacteremia: Secondary | ICD-10-CM | POA: Diagnosis not present

## 2020-03-23 DIAGNOSIS — R5381 Other malaise: Secondary | ICD-10-CM | POA: Diagnosis not present

## 2020-03-23 DIAGNOSIS — N183 Chronic kidney disease, stage 3 unspecified: Secondary | ICD-10-CM | POA: Diagnosis not present

## 2020-03-23 DIAGNOSIS — R7881 Bacteremia: Secondary | ICD-10-CM | POA: Diagnosis not present

## 2020-03-23 DIAGNOSIS — Z794 Long term (current) use of insulin: Secondary | ICD-10-CM | POA: Diagnosis not present

## 2020-03-23 DIAGNOSIS — I2510 Atherosclerotic heart disease of native coronary artery without angina pectoris: Secondary | ICD-10-CM | POA: Diagnosis not present

## 2020-03-23 DIAGNOSIS — M25552 Pain in left hip: Secondary | ICD-10-CM | POA: Diagnosis not present

## 2020-03-23 DIAGNOSIS — E1151 Type 2 diabetes mellitus with diabetic peripheral angiopathy without gangrene: Secondary | ICD-10-CM | POA: Diagnosis not present

## 2020-03-23 DIAGNOSIS — B952 Enterococcus as the cause of diseases classified elsewhere: Secondary | ICD-10-CM | POA: Diagnosis not present

## 2020-03-23 DIAGNOSIS — E785 Hyperlipidemia, unspecified: Secondary | ICD-10-CM | POA: Diagnosis not present

## 2020-03-24 DIAGNOSIS — I5032 Chronic diastolic (congestive) heart failure: Secondary | ICD-10-CM | POA: Diagnosis not present

## 2020-03-24 DIAGNOSIS — N183 Chronic kidney disease, stage 3 unspecified: Secondary | ICD-10-CM | POA: Diagnosis not present

## 2020-03-24 DIAGNOSIS — M25552 Pain in left hip: Secondary | ICD-10-CM | POA: Diagnosis not present

## 2020-03-24 DIAGNOSIS — E1151 Type 2 diabetes mellitus with diabetic peripheral angiopathy without gangrene: Secondary | ICD-10-CM | POA: Diagnosis not present

## 2020-03-24 DIAGNOSIS — I96 Gangrene, not elsewhere classified: Secondary | ICD-10-CM | POA: Diagnosis not present

## 2020-03-24 DIAGNOSIS — R5381 Other malaise: Secondary | ICD-10-CM | POA: Diagnosis not present

## 2020-03-24 DIAGNOSIS — Z794 Long term (current) use of insulin: Secondary | ICD-10-CM | POA: Diagnosis not present

## 2020-03-25 DIAGNOSIS — L89620 Pressure ulcer of left heel, unstageable: Secondary | ICD-10-CM | POA: Diagnosis not present

## 2020-03-25 DIAGNOSIS — E11622 Type 2 diabetes mellitus with other skin ulcer: Secondary | ICD-10-CM | POA: Diagnosis not present

## 2020-03-25 DIAGNOSIS — I96 Gangrene, not elsewhere classified: Secondary | ICD-10-CM | POA: Diagnosis not present

## 2020-03-25 DIAGNOSIS — E1152 Type 2 diabetes mellitus with diabetic peripheral angiopathy with gangrene: Secondary | ICD-10-CM | POA: Diagnosis not present

## 2020-03-25 DIAGNOSIS — Z794 Long term (current) use of insulin: Secondary | ICD-10-CM | POA: Diagnosis not present

## 2020-03-25 DIAGNOSIS — L97822 Non-pressure chronic ulcer of other part of left lower leg with fat layer exposed: Secondary | ICD-10-CM | POA: Diagnosis not present

## 2020-03-25 DIAGNOSIS — R5381 Other malaise: Secondary | ICD-10-CM | POA: Diagnosis not present

## 2020-03-25 DIAGNOSIS — E1151 Type 2 diabetes mellitus with diabetic peripheral angiopathy without gangrene: Secondary | ICD-10-CM | POA: Diagnosis not present

## 2020-03-25 DIAGNOSIS — M25552 Pain in left hip: Secondary | ICD-10-CM | POA: Diagnosis not present

## 2020-03-25 DIAGNOSIS — I7025 Atherosclerosis of native arteries of other extremities with ulceration: Secondary | ICD-10-CM | POA: Diagnosis not present

## 2020-03-25 DIAGNOSIS — I739 Peripheral vascular disease, unspecified: Secondary | ICD-10-CM | POA: Diagnosis not present

## 2020-03-25 DIAGNOSIS — I5032 Chronic diastolic (congestive) heart failure: Secondary | ICD-10-CM | POA: Diagnosis not present

## 2020-03-25 DIAGNOSIS — E1159 Type 2 diabetes mellitus with other circulatory complications: Secondary | ICD-10-CM | POA: Diagnosis not present

## 2020-03-25 DIAGNOSIS — N183 Chronic kidney disease, stage 3 unspecified: Secondary | ICD-10-CM | POA: Diagnosis not present

## 2020-03-26 DIAGNOSIS — I96 Gangrene, not elsewhere classified: Secondary | ICD-10-CM | POA: Diagnosis not present

## 2020-03-26 DIAGNOSIS — Z794 Long term (current) use of insulin: Secondary | ICD-10-CM | POA: Diagnosis not present

## 2020-03-26 DIAGNOSIS — R5381 Other malaise: Secondary | ICD-10-CM | POA: Diagnosis not present

## 2020-03-26 DIAGNOSIS — E1151 Type 2 diabetes mellitus with diabetic peripheral angiopathy without gangrene: Secondary | ICD-10-CM | POA: Diagnosis not present

## 2020-03-26 DIAGNOSIS — M25552 Pain in left hip: Secondary | ICD-10-CM | POA: Diagnosis not present

## 2020-03-26 DIAGNOSIS — N183 Chronic kidney disease, stage 3 unspecified: Secondary | ICD-10-CM | POA: Diagnosis not present

## 2020-03-26 DIAGNOSIS — I5032 Chronic diastolic (congestive) heart failure: Secondary | ICD-10-CM | POA: Diagnosis not present

## 2020-03-27 DIAGNOSIS — N183 Chronic kidney disease, stage 3 unspecified: Secondary | ICD-10-CM | POA: Diagnosis not present

## 2020-03-27 DIAGNOSIS — I5032 Chronic diastolic (congestive) heart failure: Secondary | ICD-10-CM | POA: Diagnosis not present

## 2020-03-27 DIAGNOSIS — E1151 Type 2 diabetes mellitus with diabetic peripheral angiopathy without gangrene: Secondary | ICD-10-CM | POA: Diagnosis not present

## 2020-03-27 DIAGNOSIS — M25552 Pain in left hip: Secondary | ICD-10-CM | POA: Diagnosis not present

## 2020-03-27 DIAGNOSIS — Z794 Long term (current) use of insulin: Secondary | ICD-10-CM | POA: Diagnosis not present

## 2020-03-27 DIAGNOSIS — R5381 Other malaise: Secondary | ICD-10-CM | POA: Diagnosis not present

## 2020-03-27 DIAGNOSIS — I96 Gangrene, not elsewhere classified: Secondary | ICD-10-CM | POA: Diagnosis not present

## 2020-03-28 DIAGNOSIS — E1151 Type 2 diabetes mellitus with diabetic peripheral angiopathy without gangrene: Secondary | ICD-10-CM | POA: Diagnosis not present

## 2020-03-28 DIAGNOSIS — M25552 Pain in left hip: Secondary | ICD-10-CM | POA: Diagnosis not present

## 2020-03-28 DIAGNOSIS — N183 Chronic kidney disease, stage 3 unspecified: Secondary | ICD-10-CM | POA: Diagnosis not present

## 2020-03-28 DIAGNOSIS — I96 Gangrene, not elsewhere classified: Secondary | ICD-10-CM | POA: Diagnosis not present

## 2020-03-28 DIAGNOSIS — Z794 Long term (current) use of insulin: Secondary | ICD-10-CM | POA: Diagnosis not present

## 2020-03-28 DIAGNOSIS — R5381 Other malaise: Secondary | ICD-10-CM | POA: Diagnosis not present

## 2020-03-28 DIAGNOSIS — I5032 Chronic diastolic (congestive) heart failure: Secondary | ICD-10-CM | POA: Diagnosis not present

## 2020-03-29 DIAGNOSIS — R5381 Other malaise: Secondary | ICD-10-CM | POA: Diagnosis not present

## 2020-03-29 DIAGNOSIS — Z794 Long term (current) use of insulin: Secondary | ICD-10-CM | POA: Diagnosis not present

## 2020-03-29 DIAGNOSIS — E1151 Type 2 diabetes mellitus with diabetic peripheral angiopathy without gangrene: Secondary | ICD-10-CM | POA: Diagnosis not present

## 2020-03-29 DIAGNOSIS — N183 Chronic kidney disease, stage 3 unspecified: Secondary | ICD-10-CM | POA: Diagnosis not present

## 2020-03-29 DIAGNOSIS — I5032 Chronic diastolic (congestive) heart failure: Secondary | ICD-10-CM | POA: Diagnosis not present

## 2020-03-29 DIAGNOSIS — M25552 Pain in left hip: Secondary | ICD-10-CM | POA: Diagnosis not present

## 2020-03-29 DIAGNOSIS — I96 Gangrene, not elsewhere classified: Secondary | ICD-10-CM | POA: Diagnosis not present

## 2020-03-30 DIAGNOSIS — M25552 Pain in left hip: Secondary | ICD-10-CM | POA: Diagnosis not present

## 2020-03-30 DIAGNOSIS — I96 Gangrene, not elsewhere classified: Secondary | ICD-10-CM | POA: Diagnosis not present

## 2020-03-30 DIAGNOSIS — N183 Chronic kidney disease, stage 3 unspecified: Secondary | ICD-10-CM | POA: Diagnosis not present

## 2020-03-30 DIAGNOSIS — Z794 Long term (current) use of insulin: Secondary | ICD-10-CM | POA: Diagnosis not present

## 2020-03-30 DIAGNOSIS — R5381 Other malaise: Secondary | ICD-10-CM | POA: Diagnosis not present

## 2020-03-30 DIAGNOSIS — I5032 Chronic diastolic (congestive) heart failure: Secondary | ICD-10-CM | POA: Diagnosis not present

## 2020-03-30 DIAGNOSIS — E1151 Type 2 diabetes mellitus with diabetic peripheral angiopathy without gangrene: Secondary | ICD-10-CM | POA: Diagnosis not present

## 2020-03-31 DIAGNOSIS — I96 Gangrene, not elsewhere classified: Secondary | ICD-10-CM | POA: Diagnosis not present

## 2020-03-31 DIAGNOSIS — M25552 Pain in left hip: Secondary | ICD-10-CM | POA: Diagnosis not present

## 2020-03-31 DIAGNOSIS — Z794 Long term (current) use of insulin: Secondary | ICD-10-CM | POA: Diagnosis not present

## 2020-03-31 DIAGNOSIS — R5381 Other malaise: Secondary | ICD-10-CM | POA: Diagnosis not present

## 2020-03-31 DIAGNOSIS — E1151 Type 2 diabetes mellitus with diabetic peripheral angiopathy without gangrene: Secondary | ICD-10-CM | POA: Diagnosis not present

## 2020-03-31 DIAGNOSIS — I5032 Chronic diastolic (congestive) heart failure: Secondary | ICD-10-CM | POA: Diagnosis not present

## 2020-03-31 DIAGNOSIS — N183 Chronic kidney disease, stage 3 unspecified: Secondary | ICD-10-CM | POA: Diagnosis not present

## 2020-04-01 DIAGNOSIS — L89620 Pressure ulcer of left heel, unstageable: Secondary | ICD-10-CM | POA: Diagnosis not present

## 2020-04-01 DIAGNOSIS — N183 Chronic kidney disease, stage 3 unspecified: Secondary | ICD-10-CM | POA: Diagnosis not present

## 2020-04-01 DIAGNOSIS — B952 Enterococcus as the cause of diseases classified elsewhere: Secondary | ICD-10-CM | POA: Diagnosis not present

## 2020-04-01 DIAGNOSIS — E1159 Type 2 diabetes mellitus with other circulatory complications: Secondary | ICD-10-CM | POA: Diagnosis not present

## 2020-04-01 DIAGNOSIS — L89520 Pressure ulcer of left ankle, unstageable: Secondary | ICD-10-CM | POA: Diagnosis not present

## 2020-04-01 DIAGNOSIS — E1151 Type 2 diabetes mellitus with diabetic peripheral angiopathy without gangrene: Secondary | ICD-10-CM | POA: Diagnosis not present

## 2020-04-01 DIAGNOSIS — R7881 Bacteremia: Secondary | ICD-10-CM | POA: Diagnosis not present

## 2020-04-01 DIAGNOSIS — I70248 Atherosclerosis of native arteries of left leg with ulceration of other part of lower left leg: Secondary | ICD-10-CM | POA: Diagnosis not present

## 2020-04-01 DIAGNOSIS — E785 Hyperlipidemia, unspecified: Secondary | ICD-10-CM | POA: Diagnosis not present

## 2020-04-01 DIAGNOSIS — R5381 Other malaise: Secondary | ICD-10-CM | POA: Diagnosis not present

## 2020-04-01 DIAGNOSIS — I7025 Atherosclerosis of native arteries of other extremities with ulceration: Secondary | ICD-10-CM | POA: Diagnosis not present

## 2020-04-01 DIAGNOSIS — L97822 Non-pressure chronic ulcer of other part of left lower leg with fat layer exposed: Secondary | ICD-10-CM | POA: Diagnosis not present

## 2020-04-01 DIAGNOSIS — Z794 Long term (current) use of insulin: Secondary | ICD-10-CM | POA: Diagnosis not present

## 2020-04-01 DIAGNOSIS — T8131XA Disruption of external operation (surgical) wound, not elsewhere classified, initial encounter: Secondary | ICD-10-CM | POA: Diagnosis not present

## 2020-04-01 DIAGNOSIS — I739 Peripheral vascular disease, unspecified: Secondary | ICD-10-CM | POA: Diagnosis not present

## 2020-04-01 DIAGNOSIS — I2510 Atherosclerotic heart disease of native coronary artery without angina pectoris: Secondary | ICD-10-CM | POA: Diagnosis not present

## 2020-04-01 DIAGNOSIS — M25552 Pain in left hip: Secondary | ICD-10-CM | POA: Diagnosis not present

## 2020-04-04 DIAGNOSIS — L97328 Non-pressure chronic ulcer of left ankle with other specified severity: Secondary | ICD-10-CM | POA: Diagnosis not present

## 2020-04-04 DIAGNOSIS — T8781 Dehiscence of amputation stump: Secondary | ICD-10-CM | POA: Diagnosis not present

## 2020-04-04 DIAGNOSIS — E11621 Type 2 diabetes mellitus with foot ulcer: Secondary | ICD-10-CM | POA: Diagnosis not present

## 2020-04-04 DIAGNOSIS — T8744 Infection of amputation stump, left lower extremity: Secondary | ICD-10-CM | POA: Diagnosis not present

## 2020-04-04 DIAGNOSIS — E1152 Type 2 diabetes mellitus with diabetic peripheral angiopathy with gangrene: Secondary | ICD-10-CM | POA: Diagnosis not present

## 2020-04-04 DIAGNOSIS — I70248 Atherosclerosis of native arteries of left leg with ulceration of other part of lower left leg: Secondary | ICD-10-CM | POA: Diagnosis not present

## 2020-04-04 DIAGNOSIS — L97828 Non-pressure chronic ulcer of other part of left lower leg with other specified severity: Secondary | ICD-10-CM | POA: Diagnosis not present

## 2020-04-04 DIAGNOSIS — B952 Enterococcus as the cause of diseases classified elsewhere: Secondary | ICD-10-CM | POA: Diagnosis not present

## 2020-04-04 DIAGNOSIS — L97822 Non-pressure chronic ulcer of other part of left lower leg with fat layer exposed: Secondary | ICD-10-CM | POA: Diagnosis not present

## 2020-04-07 DIAGNOSIS — Z96659 Presence of unspecified artificial knee joint: Secondary | ICD-10-CM | POA: Diagnosis not present

## 2020-04-07 DIAGNOSIS — R262 Difficulty in walking, not elsewhere classified: Secondary | ICD-10-CM | POA: Diagnosis not present

## 2020-04-07 DIAGNOSIS — Z950 Presence of cardiac pacemaker: Secondary | ICD-10-CM | POA: Diagnosis not present

## 2020-04-07 DIAGNOSIS — M5459 Other low back pain: Secondary | ICD-10-CM | POA: Diagnosis not present

## 2020-04-07 DIAGNOSIS — R21 Rash and other nonspecific skin eruption: Secondary | ICD-10-CM | POA: Diagnosis not present

## 2020-04-07 DIAGNOSIS — D649 Anemia, unspecified: Secondary | ICD-10-CM | POA: Diagnosis not present

## 2020-04-07 DIAGNOSIS — Z79899 Other long term (current) drug therapy: Secondary | ICD-10-CM | POA: Diagnosis not present

## 2020-04-07 DIAGNOSIS — N189 Chronic kidney disease, unspecified: Secondary | ICD-10-CM | POA: Diagnosis not present

## 2020-04-07 DIAGNOSIS — T8459XS Infection and inflammatory reaction due to other internal joint prosthesis, sequela: Secondary | ICD-10-CM | POA: Diagnosis not present

## 2020-04-08 DIAGNOSIS — Z452 Encounter for adjustment and management of vascular access device: Secondary | ICD-10-CM | POA: Diagnosis not present

## 2020-04-08 DIAGNOSIS — L97828 Non-pressure chronic ulcer of other part of left lower leg with other specified severity: Secondary | ICD-10-CM | POA: Diagnosis not present

## 2020-04-08 DIAGNOSIS — E11622 Type 2 diabetes mellitus with other skin ulcer: Secondary | ICD-10-CM | POA: Diagnosis not present

## 2020-04-08 DIAGNOSIS — I442 Atrioventricular block, complete: Secondary | ICD-10-CM | POA: Diagnosis not present

## 2020-04-08 DIAGNOSIS — I130 Hypertensive heart and chronic kidney disease with heart failure and stage 1 through stage 4 chronic kidney disease, or unspecified chronic kidney disease: Secondary | ICD-10-CM | POA: Diagnosis not present

## 2020-04-08 DIAGNOSIS — E1152 Type 2 diabetes mellitus with diabetic peripheral angiopathy with gangrene: Secondary | ICD-10-CM | POA: Diagnosis not present

## 2020-04-08 DIAGNOSIS — L89620 Pressure ulcer of left heel, unstageable: Secondary | ICD-10-CM | POA: Diagnosis not present

## 2020-04-08 DIAGNOSIS — E1151 Type 2 diabetes mellitus with diabetic peripheral angiopathy without gangrene: Secondary | ICD-10-CM | POA: Diagnosis not present

## 2020-04-08 DIAGNOSIS — N281 Cyst of kidney, acquired: Secondary | ICD-10-CM | POA: Diagnosis not present

## 2020-04-08 DIAGNOSIS — T8781 Dehiscence of amputation stump: Secondary | ICD-10-CM | POA: Diagnosis not present

## 2020-04-08 DIAGNOSIS — M19041 Primary osteoarthritis, right hand: Secondary | ICD-10-CM | POA: Diagnosis not present

## 2020-04-08 DIAGNOSIS — R5381 Other malaise: Secondary | ICD-10-CM | POA: Diagnosis not present

## 2020-04-08 DIAGNOSIS — I330 Acute and subacute infective endocarditis: Secondary | ICD-10-CM | POA: Diagnosis not present

## 2020-04-08 DIAGNOSIS — I7389 Other specified peripheral vascular diseases: Secondary | ICD-10-CM | POA: Diagnosis not present

## 2020-04-08 DIAGNOSIS — N183 Chronic kidney disease, stage 3 unspecified: Secondary | ICD-10-CM | POA: Diagnosis not present

## 2020-04-08 DIAGNOSIS — D62 Acute posthemorrhagic anemia: Secondary | ICD-10-CM | POA: Diagnosis not present

## 2020-04-08 DIAGNOSIS — I517 Cardiomegaly: Secondary | ICD-10-CM | POA: Diagnosis not present

## 2020-04-08 DIAGNOSIS — I70261 Atherosclerosis of native arteries of extremities with gangrene, right leg: Secondary | ICD-10-CM | POA: Diagnosis not present

## 2020-04-08 DIAGNOSIS — M79641 Pain in right hand: Secondary | ICD-10-CM | POA: Diagnosis not present

## 2020-04-08 DIAGNOSIS — T826XXA Infection and inflammatory reaction due to cardiac valve prosthesis, initial encounter: Secondary | ICD-10-CM | POA: Diagnosis not present

## 2020-04-08 DIAGNOSIS — I420 Dilated cardiomyopathy: Secondary | ICD-10-CM | POA: Diagnosis not present

## 2020-04-08 DIAGNOSIS — I5032 Chronic diastolic (congestive) heart failure: Secondary | ICD-10-CM | POA: Diagnosis not present

## 2020-04-08 DIAGNOSIS — I70248 Atherosclerosis of native arteries of left leg with ulceration of other part of lower left leg: Secondary | ICD-10-CM | POA: Diagnosis not present

## 2020-04-08 DIAGNOSIS — K219 Gastro-esophageal reflux disease without esophagitis: Secondary | ICD-10-CM | POA: Diagnosis not present

## 2020-04-08 DIAGNOSIS — E876 Hypokalemia: Secondary | ICD-10-CM | POA: Diagnosis not present

## 2020-04-08 DIAGNOSIS — L97328 Non-pressure chronic ulcer of left ankle with other specified severity: Secondary | ICD-10-CM | POA: Diagnosis not present

## 2020-04-08 DIAGNOSIS — L97322 Non-pressure chronic ulcer of left ankle with fat layer exposed: Secondary | ICD-10-CM | POA: Diagnosis not present

## 2020-04-08 DIAGNOSIS — M7989 Other specified soft tissue disorders: Secondary | ICD-10-CM | POA: Diagnosis not present

## 2020-04-08 DIAGNOSIS — Z794 Long term (current) use of insulin: Secondary | ICD-10-CM | POA: Diagnosis not present

## 2020-04-08 DIAGNOSIS — B952 Enterococcus as the cause of diseases classified elsewhere: Secondary | ICD-10-CM | POA: Diagnosis not present

## 2020-04-08 DIAGNOSIS — D649 Anemia, unspecified: Secondary | ICD-10-CM | POA: Diagnosis not present

## 2020-04-08 DIAGNOSIS — I340 Nonrheumatic mitral (valve) insufficiency: Secondary | ICD-10-CM | POA: Diagnosis not present

## 2020-04-08 DIAGNOSIS — I361 Nonrheumatic tricuspid (valve) insufficiency: Secondary | ICD-10-CM | POA: Diagnosis not present

## 2020-04-08 DIAGNOSIS — L97822 Non-pressure chronic ulcer of other part of left lower leg with fat layer exposed: Secondary | ICD-10-CM | POA: Diagnosis not present

## 2020-04-08 DIAGNOSIS — T8454XD Infection and inflammatory reaction due to internal left knee prosthesis, subsequent encounter: Secondary | ICD-10-CM | POA: Diagnosis not present

## 2020-04-08 DIAGNOSIS — I70245 Atherosclerosis of native arteries of left leg with ulceration of other part of foot: Secondary | ICD-10-CM | POA: Diagnosis not present

## 2020-04-08 DIAGNOSIS — Z20822 Contact with and (suspected) exposure to covid-19: Secondary | ICD-10-CM | POA: Diagnosis not present

## 2020-04-08 DIAGNOSIS — M25552 Pain in left hip: Secondary | ICD-10-CM | POA: Diagnosis not present

## 2020-04-08 DIAGNOSIS — Z0389 Encounter for observation for other suspected diseases and conditions ruled out: Secondary | ICD-10-CM | POA: Diagnosis not present

## 2020-04-08 DIAGNOSIS — E1169 Type 2 diabetes mellitus with other specified complication: Secondary | ICD-10-CM | POA: Diagnosis not present

## 2020-04-08 DIAGNOSIS — T827XXD Infection and inflammatory reaction due to other cardiac and vascular devices, implants and grafts, subsequent encounter: Secondary | ICD-10-CM | POA: Diagnosis not present

## 2020-04-08 DIAGNOSIS — I70234 Atherosclerosis of native arteries of right leg with ulceration of heel and midfoot: Secondary | ICD-10-CM | POA: Diagnosis not present

## 2020-04-08 DIAGNOSIS — N179 Acute kidney failure, unspecified: Secondary | ICD-10-CM | POA: Diagnosis not present

## 2020-04-08 DIAGNOSIS — Z950 Presence of cardiac pacemaker: Secondary | ICD-10-CM | POA: Diagnosis not present

## 2020-04-08 DIAGNOSIS — T8744 Infection of amputation stump, left lower extremity: Secondary | ICD-10-CM | POA: Diagnosis not present

## 2020-04-08 DIAGNOSIS — I2510 Atherosclerotic heart disease of native coronary artery without angina pectoris: Secondary | ICD-10-CM | POA: Diagnosis not present

## 2020-04-08 DIAGNOSIS — Z952 Presence of prosthetic heart valve: Secondary | ICD-10-CM | POA: Diagnosis not present

## 2020-04-08 DIAGNOSIS — G8918 Other acute postprocedural pain: Secondary | ICD-10-CM | POA: Diagnosis not present

## 2020-04-08 DIAGNOSIS — N189 Chronic kidney disease, unspecified: Secondary | ICD-10-CM | POA: Diagnosis not present

## 2020-04-08 DIAGNOSIS — L97529 Non-pressure chronic ulcer of other part of left foot with unspecified severity: Secondary | ICD-10-CM | POA: Diagnosis not present

## 2020-04-08 DIAGNOSIS — I739 Peripheral vascular disease, unspecified: Secondary | ICD-10-CM | POA: Diagnosis not present

## 2020-04-08 DIAGNOSIS — L89323 Pressure ulcer of left buttock, stage 3: Secondary | ICD-10-CM | POA: Diagnosis not present

## 2020-04-08 DIAGNOSIS — S81802A Unspecified open wound, left lower leg, initial encounter: Secondary | ICD-10-CM | POA: Diagnosis not present

## 2020-04-08 DIAGNOSIS — R7881 Bacteremia: Secondary | ICD-10-CM | POA: Diagnosis not present

## 2020-04-08 DIAGNOSIS — E1122 Type 2 diabetes mellitus with diabetic chronic kidney disease: Secondary | ICD-10-CM | POA: Diagnosis not present

## 2020-04-08 DIAGNOSIS — E11621 Type 2 diabetes mellitus with foot ulcer: Secondary | ICD-10-CM | POA: Diagnosis not present

## 2020-04-08 DIAGNOSIS — I96 Gangrene, not elsewhere classified: Secondary | ICD-10-CM | POA: Diagnosis not present

## 2020-04-08 DIAGNOSIS — T8454XA Infection and inflammatory reaction due to internal left knee prosthesis, initial encounter: Secondary | ICD-10-CM | POA: Diagnosis not present

## 2020-04-08 DIAGNOSIS — R5383 Other fatigue: Secondary | ICD-10-CM | POA: Diagnosis not present

## 2020-04-08 DIAGNOSIS — I70238 Atherosclerosis of native arteries of right leg with ulceration of other part of lower right leg: Secondary | ICD-10-CM | POA: Diagnosis not present

## 2020-04-08 DIAGNOSIS — E1165 Type 2 diabetes mellitus with hyperglycemia: Secondary | ICD-10-CM | POA: Diagnosis not present

## 2020-04-08 DIAGNOSIS — I10 Essential (primary) hypertension: Secondary | ICD-10-CM | POA: Diagnosis not present

## 2020-04-09 DIAGNOSIS — T8744 Infection of amputation stump, left lower extremity: Secondary | ICD-10-CM | POA: Diagnosis not present

## 2020-04-09 DIAGNOSIS — E1152 Type 2 diabetes mellitus with diabetic peripheral angiopathy with gangrene: Secondary | ICD-10-CM | POA: Diagnosis not present

## 2020-04-09 DIAGNOSIS — L97328 Non-pressure chronic ulcer of left ankle with other specified severity: Secondary | ICD-10-CM | POA: Diagnosis not present

## 2020-04-09 DIAGNOSIS — T8454XD Infection and inflammatory reaction due to internal left knee prosthesis, subsequent encounter: Secondary | ICD-10-CM | POA: Diagnosis not present

## 2020-04-09 DIAGNOSIS — E11621 Type 2 diabetes mellitus with foot ulcer: Secondary | ICD-10-CM | POA: Diagnosis not present

## 2020-04-09 DIAGNOSIS — L97828 Non-pressure chronic ulcer of other part of left lower leg with other specified severity: Secondary | ICD-10-CM | POA: Diagnosis not present

## 2020-04-09 DIAGNOSIS — L97822 Non-pressure chronic ulcer of other part of left lower leg with fat layer exposed: Secondary | ICD-10-CM | POA: Diagnosis not present

## 2020-04-09 DIAGNOSIS — B952 Enterococcus as the cause of diseases classified elsewhere: Secondary | ICD-10-CM | POA: Diagnosis not present

## 2020-04-09 DIAGNOSIS — I70248 Atherosclerosis of native arteries of left leg with ulceration of other part of lower left leg: Secondary | ICD-10-CM | POA: Diagnosis not present

## 2020-04-09 DIAGNOSIS — R7881 Bacteremia: Secondary | ICD-10-CM | POA: Diagnosis not present

## 2020-04-09 DIAGNOSIS — N179 Acute kidney failure, unspecified: Secondary | ICD-10-CM | POA: Diagnosis not present

## 2020-04-09 DIAGNOSIS — T8781 Dehiscence of amputation stump: Secondary | ICD-10-CM | POA: Diagnosis not present

## 2020-04-28 DIAGNOSIS — I272 Pulmonary hypertension, unspecified: Secondary | ICD-10-CM | POA: Diagnosis not present

## 2020-04-28 DIAGNOSIS — L89620 Pressure ulcer of left heel, unstageable: Secondary | ICD-10-CM | POA: Diagnosis not present

## 2020-04-28 DIAGNOSIS — Z89612 Acquired absence of left leg above knee: Secondary | ICD-10-CM | POA: Diagnosis not present

## 2020-04-28 DIAGNOSIS — K219 Gastro-esophageal reflux disease without esophagitis: Secondary | ICD-10-CM | POA: Diagnosis not present

## 2020-04-28 DIAGNOSIS — I739 Peripheral vascular disease, unspecified: Secondary | ICD-10-CM | POA: Diagnosis not present

## 2020-04-28 DIAGNOSIS — T827XXD Infection and inflammatory reaction due to other cardiac and vascular devices, implants and grafts, subsequent encounter: Secondary | ICD-10-CM | POA: Diagnosis not present

## 2020-04-28 DIAGNOSIS — M7989 Other specified soft tissue disorders: Secondary | ICD-10-CM | POA: Diagnosis not present

## 2020-04-28 DIAGNOSIS — N179 Acute kidney failure, unspecified: Secondary | ICD-10-CM | POA: Diagnosis not present

## 2020-04-28 DIAGNOSIS — I5032 Chronic diastolic (congestive) heart failure: Secondary | ICD-10-CM | POA: Diagnosis not present

## 2020-04-28 DIAGNOSIS — B952 Enterococcus as the cause of diseases classified elsewhere: Secondary | ICD-10-CM | POA: Diagnosis not present

## 2020-04-28 DIAGNOSIS — R600 Localized edema: Secondary | ICD-10-CM | POA: Diagnosis not present

## 2020-04-28 DIAGNOSIS — T8454XD Infection and inflammatory reaction due to internal left knee prosthesis, subsequent encounter: Secondary | ICD-10-CM | POA: Diagnosis not present

## 2020-04-28 DIAGNOSIS — L89323 Pressure ulcer of left buttock, stage 3: Secondary | ICD-10-CM | POA: Diagnosis not present

## 2020-04-28 DIAGNOSIS — Z794 Long term (current) use of insulin: Secondary | ICD-10-CM | POA: Diagnosis not present

## 2020-04-28 DIAGNOSIS — I420 Dilated cardiomyopathy: Secondary | ICD-10-CM | POA: Diagnosis not present

## 2020-04-28 DIAGNOSIS — I130 Hypertensive heart and chronic kidney disease with heart failure and stage 1 through stage 4 chronic kidney disease, or unspecified chronic kidney disease: Secondary | ICD-10-CM | POA: Diagnosis not present

## 2020-04-28 DIAGNOSIS — E1122 Type 2 diabetes mellitus with diabetic chronic kidney disease: Secondary | ICD-10-CM | POA: Diagnosis not present

## 2020-04-28 DIAGNOSIS — R5381 Other malaise: Secondary | ICD-10-CM | POA: Diagnosis not present

## 2020-04-28 DIAGNOSIS — R0989 Other specified symptoms and signs involving the circulatory and respiratory systems: Secondary | ICD-10-CM | POA: Diagnosis not present

## 2020-04-28 DIAGNOSIS — E1169 Type 2 diabetes mellitus with other specified complication: Secondary | ICD-10-CM | POA: Diagnosis not present

## 2020-04-28 DIAGNOSIS — Z452 Encounter for adjustment and management of vascular access device: Secondary | ICD-10-CM | POA: Diagnosis not present

## 2020-04-28 DIAGNOSIS — T8454XA Infection and inflammatory reaction due to internal left knee prosthesis, initial encounter: Secondary | ICD-10-CM | POA: Diagnosis not present

## 2020-04-28 DIAGNOSIS — R7881 Bacteremia: Secondary | ICD-10-CM | POA: Diagnosis not present

## 2020-04-28 DIAGNOSIS — D62 Acute posthemorrhagic anemia: Secondary | ICD-10-CM | POA: Diagnosis not present

## 2020-04-28 DIAGNOSIS — I270 Primary pulmonary hypertension: Secondary | ICD-10-CM | POA: Diagnosis not present

## 2020-04-28 DIAGNOSIS — Z950 Presence of cardiac pacemaker: Secondary | ICD-10-CM | POA: Diagnosis not present

## 2020-04-28 DIAGNOSIS — I700 Atherosclerosis of aorta: Secondary | ICD-10-CM | POA: Diagnosis not present

## 2020-04-28 DIAGNOSIS — N183 Chronic kidney disease, stage 3 unspecified: Secondary | ICD-10-CM | POA: Diagnosis not present

## 2020-04-28 DIAGNOSIS — E1151 Type 2 diabetes mellitus with diabetic peripheral angiopathy without gangrene: Secondary | ICD-10-CM | POA: Diagnosis not present

## 2020-04-28 DIAGNOSIS — E1152 Type 2 diabetes mellitus with diabetic peripheral angiopathy with gangrene: Secondary | ICD-10-CM | POA: Diagnosis not present

## 2020-04-28 DIAGNOSIS — I2510 Atherosclerotic heart disease of native coronary artery without angina pectoris: Secondary | ICD-10-CM | POA: Diagnosis not present

## 2020-04-29 DIAGNOSIS — E1169 Type 2 diabetes mellitus with other specified complication: Secondary | ICD-10-CM | POA: Diagnosis not present

## 2020-04-29 DIAGNOSIS — E1152 Type 2 diabetes mellitus with diabetic peripheral angiopathy with gangrene: Secondary | ICD-10-CM | POA: Diagnosis not present

## 2020-04-29 DIAGNOSIS — Z794 Long term (current) use of insulin: Secondary | ICD-10-CM | POA: Diagnosis not present

## 2020-04-29 DIAGNOSIS — I5032 Chronic diastolic (congestive) heart failure: Secondary | ICD-10-CM | POA: Diagnosis not present

## 2020-04-29 DIAGNOSIS — I2510 Atherosclerotic heart disease of native coronary artery without angina pectoris: Secondary | ICD-10-CM | POA: Diagnosis not present

## 2020-04-29 DIAGNOSIS — R7881 Bacteremia: Secondary | ICD-10-CM | POA: Diagnosis not present

## 2020-04-29 DIAGNOSIS — R5381 Other malaise: Secondary | ICD-10-CM | POA: Diagnosis not present

## 2020-04-29 DIAGNOSIS — B952 Enterococcus as the cause of diseases classified elsewhere: Secondary | ICD-10-CM | POA: Diagnosis not present

## 2020-04-29 DIAGNOSIS — K219 Gastro-esophageal reflux disease without esophagitis: Secondary | ICD-10-CM | POA: Diagnosis not present

## 2020-04-30 DIAGNOSIS — I272 Pulmonary hypertension, unspecified: Secondary | ICD-10-CM | POA: Diagnosis not present

## 2020-04-30 DIAGNOSIS — K219 Gastro-esophageal reflux disease without esophagitis: Secondary | ICD-10-CM | POA: Diagnosis not present

## 2020-04-30 DIAGNOSIS — E1152 Type 2 diabetes mellitus with diabetic peripheral angiopathy with gangrene: Secondary | ICD-10-CM | POA: Diagnosis not present

## 2020-04-30 DIAGNOSIS — Z794 Long term (current) use of insulin: Secondary | ICD-10-CM | POA: Diagnosis not present

## 2020-04-30 DIAGNOSIS — R7881 Bacteremia: Secondary | ICD-10-CM | POA: Diagnosis not present

## 2020-04-30 DIAGNOSIS — B952 Enterococcus as the cause of diseases classified elsewhere: Secondary | ICD-10-CM | POA: Diagnosis not present

## 2020-04-30 DIAGNOSIS — I5032 Chronic diastolic (congestive) heart failure: Secondary | ICD-10-CM | POA: Diagnosis not present

## 2020-04-30 DIAGNOSIS — R5381 Other malaise: Secondary | ICD-10-CM | POA: Diagnosis not present

## 2020-04-30 DIAGNOSIS — I2510 Atherosclerotic heart disease of native coronary artery without angina pectoris: Secondary | ICD-10-CM | POA: Diagnosis not present

## 2020-05-01 DIAGNOSIS — I5032 Chronic diastolic (congestive) heart failure: Secondary | ICD-10-CM | POA: Diagnosis not present

## 2020-05-01 DIAGNOSIS — R5381 Other malaise: Secondary | ICD-10-CM | POA: Diagnosis not present

## 2020-05-01 DIAGNOSIS — I272 Pulmonary hypertension, unspecified: Secondary | ICD-10-CM | POA: Diagnosis not present

## 2020-05-01 DIAGNOSIS — I2510 Atherosclerotic heart disease of native coronary artery without angina pectoris: Secondary | ICD-10-CM | POA: Diagnosis not present

## 2020-05-01 DIAGNOSIS — R7881 Bacteremia: Secondary | ICD-10-CM | POA: Diagnosis not present

## 2020-05-01 DIAGNOSIS — K219 Gastro-esophageal reflux disease without esophagitis: Secondary | ICD-10-CM | POA: Diagnosis not present

## 2020-05-01 DIAGNOSIS — B952 Enterococcus as the cause of diseases classified elsewhere: Secondary | ICD-10-CM | POA: Diagnosis not present

## 2020-05-01 DIAGNOSIS — Z794 Long term (current) use of insulin: Secondary | ICD-10-CM | POA: Diagnosis not present

## 2020-05-01 DIAGNOSIS — E1152 Type 2 diabetes mellitus with diabetic peripheral angiopathy with gangrene: Secondary | ICD-10-CM | POA: Diagnosis not present

## 2020-05-02 DIAGNOSIS — Z794 Long term (current) use of insulin: Secondary | ICD-10-CM | POA: Diagnosis not present

## 2020-05-02 DIAGNOSIS — I5032 Chronic diastolic (congestive) heart failure: Secondary | ICD-10-CM | POA: Diagnosis not present

## 2020-05-02 DIAGNOSIS — K219 Gastro-esophageal reflux disease without esophagitis: Secondary | ICD-10-CM | POA: Diagnosis not present

## 2020-05-02 DIAGNOSIS — B952 Enterococcus as the cause of diseases classified elsewhere: Secondary | ICD-10-CM | POA: Diagnosis not present

## 2020-05-02 DIAGNOSIS — I272 Pulmonary hypertension, unspecified: Secondary | ICD-10-CM | POA: Diagnosis not present

## 2020-05-02 DIAGNOSIS — I2510 Atherosclerotic heart disease of native coronary artery without angina pectoris: Secondary | ICD-10-CM | POA: Diagnosis not present

## 2020-05-02 DIAGNOSIS — E1152 Type 2 diabetes mellitus with diabetic peripheral angiopathy with gangrene: Secondary | ICD-10-CM | POA: Diagnosis not present

## 2020-05-02 DIAGNOSIS — R7881 Bacteremia: Secondary | ICD-10-CM | POA: Diagnosis not present

## 2020-05-02 DIAGNOSIS — R5381 Other malaise: Secondary | ICD-10-CM | POA: Diagnosis not present

## 2020-05-03 DIAGNOSIS — I5032 Chronic diastolic (congestive) heart failure: Secondary | ICD-10-CM | POA: Diagnosis not present

## 2020-05-03 DIAGNOSIS — B952 Enterococcus as the cause of diseases classified elsewhere: Secondary | ICD-10-CM | POA: Diagnosis not present

## 2020-05-03 DIAGNOSIS — Z794 Long term (current) use of insulin: Secondary | ICD-10-CM | POA: Diagnosis not present

## 2020-05-03 DIAGNOSIS — I2510 Atherosclerotic heart disease of native coronary artery without angina pectoris: Secondary | ICD-10-CM | POA: Diagnosis not present

## 2020-05-03 DIAGNOSIS — R7881 Bacteremia: Secondary | ICD-10-CM | POA: Diagnosis not present

## 2020-05-03 DIAGNOSIS — E1152 Type 2 diabetes mellitus with diabetic peripheral angiopathy with gangrene: Secondary | ICD-10-CM | POA: Diagnosis not present

## 2020-05-03 DIAGNOSIS — K219 Gastro-esophageal reflux disease without esophagitis: Secondary | ICD-10-CM | POA: Diagnosis not present

## 2020-05-03 DIAGNOSIS — R5381 Other malaise: Secondary | ICD-10-CM | POA: Diagnosis not present

## 2020-05-03 DIAGNOSIS — I272 Pulmonary hypertension, unspecified: Secondary | ICD-10-CM | POA: Diagnosis not present

## 2020-05-04 DIAGNOSIS — R7881 Bacteremia: Secondary | ICD-10-CM | POA: Diagnosis not present

## 2020-05-04 DIAGNOSIS — R5381 Other malaise: Secondary | ICD-10-CM | POA: Diagnosis not present

## 2020-05-04 DIAGNOSIS — I2510 Atherosclerotic heart disease of native coronary artery without angina pectoris: Secondary | ICD-10-CM | POA: Diagnosis not present

## 2020-05-04 DIAGNOSIS — Z794 Long term (current) use of insulin: Secondary | ICD-10-CM | POA: Diagnosis not present

## 2020-05-04 DIAGNOSIS — K219 Gastro-esophageal reflux disease without esophagitis: Secondary | ICD-10-CM | POA: Diagnosis not present

## 2020-05-04 DIAGNOSIS — B952 Enterococcus as the cause of diseases classified elsewhere: Secondary | ICD-10-CM | POA: Diagnosis not present

## 2020-05-04 DIAGNOSIS — I272 Pulmonary hypertension, unspecified: Secondary | ICD-10-CM | POA: Diagnosis not present

## 2020-05-04 DIAGNOSIS — I5032 Chronic diastolic (congestive) heart failure: Secondary | ICD-10-CM | POA: Diagnosis not present

## 2020-05-04 DIAGNOSIS — E1152 Type 2 diabetes mellitus with diabetic peripheral angiopathy with gangrene: Secondary | ICD-10-CM | POA: Diagnosis not present

## 2020-05-05 DIAGNOSIS — R7881 Bacteremia: Secondary | ICD-10-CM | POA: Diagnosis not present

## 2020-05-05 DIAGNOSIS — E1152 Type 2 diabetes mellitus with diabetic peripheral angiopathy with gangrene: Secondary | ICD-10-CM | POA: Diagnosis not present

## 2020-05-05 DIAGNOSIS — I272 Pulmonary hypertension, unspecified: Secondary | ICD-10-CM | POA: Diagnosis not present

## 2020-05-05 DIAGNOSIS — I5032 Chronic diastolic (congestive) heart failure: Secondary | ICD-10-CM | POA: Diagnosis not present

## 2020-05-05 DIAGNOSIS — Z794 Long term (current) use of insulin: Secondary | ICD-10-CM | POA: Diagnosis not present

## 2020-05-05 DIAGNOSIS — R5381 Other malaise: Secondary | ICD-10-CM | POA: Diagnosis not present

## 2020-05-05 DIAGNOSIS — K219 Gastro-esophageal reflux disease without esophagitis: Secondary | ICD-10-CM | POA: Diagnosis not present

## 2020-05-05 DIAGNOSIS — I2510 Atherosclerotic heart disease of native coronary artery without angina pectoris: Secondary | ICD-10-CM | POA: Diagnosis not present

## 2020-05-05 DIAGNOSIS — B952 Enterococcus as the cause of diseases classified elsewhere: Secondary | ICD-10-CM | POA: Diagnosis not present

## 2020-05-06 DIAGNOSIS — R5381 Other malaise: Secondary | ICD-10-CM | POA: Diagnosis not present

## 2020-05-06 DIAGNOSIS — Z794 Long term (current) use of insulin: Secondary | ICD-10-CM | POA: Diagnosis not present

## 2020-05-06 DIAGNOSIS — R7881 Bacteremia: Secondary | ICD-10-CM | POA: Diagnosis not present

## 2020-05-06 DIAGNOSIS — I5032 Chronic diastolic (congestive) heart failure: Secondary | ICD-10-CM | POA: Diagnosis not present

## 2020-05-06 DIAGNOSIS — B952 Enterococcus as the cause of diseases classified elsewhere: Secondary | ICD-10-CM | POA: Diagnosis not present

## 2020-05-06 DIAGNOSIS — E1152 Type 2 diabetes mellitus with diabetic peripheral angiopathy with gangrene: Secondary | ICD-10-CM | POA: Diagnosis not present

## 2020-05-06 DIAGNOSIS — K219 Gastro-esophageal reflux disease without esophagitis: Secondary | ICD-10-CM | POA: Diagnosis not present

## 2020-05-06 DIAGNOSIS — I2510 Atherosclerotic heart disease of native coronary artery without angina pectoris: Secondary | ICD-10-CM | POA: Diagnosis not present

## 2020-05-06 DIAGNOSIS — E1169 Type 2 diabetes mellitus with other specified complication: Secondary | ICD-10-CM | POA: Diagnosis not present

## 2020-05-07 DIAGNOSIS — B952 Enterococcus as the cause of diseases classified elsewhere: Secondary | ICD-10-CM | POA: Diagnosis not present

## 2020-05-07 DIAGNOSIS — E1169 Type 2 diabetes mellitus with other specified complication: Secondary | ICD-10-CM | POA: Diagnosis not present

## 2020-05-07 DIAGNOSIS — E1152 Type 2 diabetes mellitus with diabetic peripheral angiopathy with gangrene: Secondary | ICD-10-CM | POA: Diagnosis not present

## 2020-05-07 DIAGNOSIS — R7881 Bacteremia: Secondary | ICD-10-CM | POA: Diagnosis not present

## 2020-05-07 DIAGNOSIS — K219 Gastro-esophageal reflux disease without esophagitis: Secondary | ICD-10-CM | POA: Diagnosis not present

## 2020-05-07 DIAGNOSIS — Z794 Long term (current) use of insulin: Secondary | ICD-10-CM | POA: Diagnosis not present

## 2020-05-07 DIAGNOSIS — I5032 Chronic diastolic (congestive) heart failure: Secondary | ICD-10-CM | POA: Diagnosis not present

## 2020-05-07 DIAGNOSIS — I2510 Atherosclerotic heart disease of native coronary artery without angina pectoris: Secondary | ICD-10-CM | POA: Diagnosis not present

## 2020-05-07 DIAGNOSIS — R5381 Other malaise: Secondary | ICD-10-CM | POA: Diagnosis not present

## 2020-05-08 DIAGNOSIS — E1169 Type 2 diabetes mellitus with other specified complication: Secondary | ICD-10-CM | POA: Diagnosis not present

## 2020-05-08 DIAGNOSIS — I2510 Atherosclerotic heart disease of native coronary artery without angina pectoris: Secondary | ICD-10-CM | POA: Diagnosis not present

## 2020-05-08 DIAGNOSIS — B952 Enterococcus as the cause of diseases classified elsewhere: Secondary | ICD-10-CM | POA: Diagnosis not present

## 2020-05-08 DIAGNOSIS — R7881 Bacteremia: Secondary | ICD-10-CM | POA: Diagnosis not present

## 2020-05-08 DIAGNOSIS — E1152 Type 2 diabetes mellitus with diabetic peripheral angiopathy with gangrene: Secondary | ICD-10-CM | POA: Diagnosis not present

## 2020-05-08 DIAGNOSIS — Z794 Long term (current) use of insulin: Secondary | ICD-10-CM | POA: Diagnosis not present

## 2020-05-08 DIAGNOSIS — K219 Gastro-esophageal reflux disease without esophagitis: Secondary | ICD-10-CM | POA: Diagnosis not present

## 2020-05-08 DIAGNOSIS — R5381 Other malaise: Secondary | ICD-10-CM | POA: Diagnosis not present

## 2020-05-08 DIAGNOSIS — I5032 Chronic diastolic (congestive) heart failure: Secondary | ICD-10-CM | POA: Diagnosis not present

## 2020-05-09 DIAGNOSIS — K219 Gastro-esophageal reflux disease without esophagitis: Secondary | ICD-10-CM | POA: Diagnosis not present

## 2020-05-09 DIAGNOSIS — E1169 Type 2 diabetes mellitus with other specified complication: Secondary | ICD-10-CM | POA: Diagnosis not present

## 2020-05-09 DIAGNOSIS — R5381 Other malaise: Secondary | ICD-10-CM | POA: Diagnosis not present

## 2020-05-09 DIAGNOSIS — I2510 Atherosclerotic heart disease of native coronary artery without angina pectoris: Secondary | ICD-10-CM | POA: Diagnosis not present

## 2020-05-09 DIAGNOSIS — B952 Enterococcus as the cause of diseases classified elsewhere: Secondary | ICD-10-CM | POA: Diagnosis not present

## 2020-05-09 DIAGNOSIS — E1152 Type 2 diabetes mellitus with diabetic peripheral angiopathy with gangrene: Secondary | ICD-10-CM | POA: Diagnosis not present

## 2020-05-09 DIAGNOSIS — I5032 Chronic diastolic (congestive) heart failure: Secondary | ICD-10-CM | POA: Diagnosis not present

## 2020-05-09 DIAGNOSIS — R7881 Bacteremia: Secondary | ICD-10-CM | POA: Diagnosis not present

## 2020-05-09 DIAGNOSIS — Z794 Long term (current) use of insulin: Secondary | ICD-10-CM | POA: Diagnosis not present

## 2020-05-10 DIAGNOSIS — I5032 Chronic diastolic (congestive) heart failure: Secondary | ICD-10-CM | POA: Diagnosis not present

## 2020-05-10 DIAGNOSIS — I2510 Atherosclerotic heart disease of native coronary artery without angina pectoris: Secondary | ICD-10-CM | POA: Diagnosis not present

## 2020-05-10 DIAGNOSIS — R7881 Bacteremia: Secondary | ICD-10-CM | POA: Diagnosis not present

## 2020-05-10 DIAGNOSIS — R5381 Other malaise: Secondary | ICD-10-CM | POA: Diagnosis not present

## 2020-05-10 DIAGNOSIS — Z794 Long term (current) use of insulin: Secondary | ICD-10-CM | POA: Diagnosis not present

## 2020-05-10 DIAGNOSIS — K219 Gastro-esophageal reflux disease without esophagitis: Secondary | ICD-10-CM | POA: Diagnosis not present

## 2020-05-10 DIAGNOSIS — E1152 Type 2 diabetes mellitus with diabetic peripheral angiopathy with gangrene: Secondary | ICD-10-CM | POA: Diagnosis not present

## 2020-05-10 DIAGNOSIS — B952 Enterococcus as the cause of diseases classified elsewhere: Secondary | ICD-10-CM | POA: Diagnosis not present

## 2020-05-10 DIAGNOSIS — E1169 Type 2 diabetes mellitus with other specified complication: Secondary | ICD-10-CM | POA: Diagnosis not present

## 2020-05-11 DIAGNOSIS — K219 Gastro-esophageal reflux disease without esophagitis: Secondary | ICD-10-CM | POA: Diagnosis not present

## 2020-05-11 DIAGNOSIS — B952 Enterococcus as the cause of diseases classified elsewhere: Secondary | ICD-10-CM | POA: Diagnosis not present

## 2020-05-11 DIAGNOSIS — Z794 Long term (current) use of insulin: Secondary | ICD-10-CM | POA: Diagnosis not present

## 2020-05-11 DIAGNOSIS — I5032 Chronic diastolic (congestive) heart failure: Secondary | ICD-10-CM | POA: Diagnosis not present

## 2020-05-11 DIAGNOSIS — E1169 Type 2 diabetes mellitus with other specified complication: Secondary | ICD-10-CM | POA: Diagnosis not present

## 2020-05-11 DIAGNOSIS — R7881 Bacteremia: Secondary | ICD-10-CM | POA: Diagnosis not present

## 2020-05-11 DIAGNOSIS — E1152 Type 2 diabetes mellitus with diabetic peripheral angiopathy with gangrene: Secondary | ICD-10-CM | POA: Diagnosis not present

## 2020-05-11 DIAGNOSIS — R5381 Other malaise: Secondary | ICD-10-CM | POA: Diagnosis not present

## 2020-05-11 DIAGNOSIS — I2510 Atherosclerotic heart disease of native coronary artery without angina pectoris: Secondary | ICD-10-CM | POA: Diagnosis not present

## 2020-05-12 DIAGNOSIS — E1152 Type 2 diabetes mellitus with diabetic peripheral angiopathy with gangrene: Secondary | ICD-10-CM | POA: Diagnosis not present

## 2020-05-12 DIAGNOSIS — B952 Enterococcus as the cause of diseases classified elsewhere: Secondary | ICD-10-CM | POA: Diagnosis not present

## 2020-05-12 DIAGNOSIS — Z794 Long term (current) use of insulin: Secondary | ICD-10-CM | POA: Diagnosis not present

## 2020-05-12 DIAGNOSIS — K219 Gastro-esophageal reflux disease without esophagitis: Secondary | ICD-10-CM | POA: Diagnosis not present

## 2020-05-12 DIAGNOSIS — R5381 Other malaise: Secondary | ICD-10-CM | POA: Diagnosis not present

## 2020-05-12 DIAGNOSIS — I272 Pulmonary hypertension, unspecified: Secondary | ICD-10-CM | POA: Diagnosis not present

## 2020-05-12 DIAGNOSIS — I2510 Atherosclerotic heart disease of native coronary artery without angina pectoris: Secondary | ICD-10-CM | POA: Diagnosis not present

## 2020-05-12 DIAGNOSIS — R7881 Bacteremia: Secondary | ICD-10-CM | POA: Diagnosis not present

## 2020-05-12 DIAGNOSIS — I5032 Chronic diastolic (congestive) heart failure: Secondary | ICD-10-CM | POA: Diagnosis not present

## 2020-05-13 DIAGNOSIS — R7881 Bacteremia: Secondary | ICD-10-CM | POA: Diagnosis not present

## 2020-05-13 DIAGNOSIS — I2510 Atherosclerotic heart disease of native coronary artery without angina pectoris: Secondary | ICD-10-CM | POA: Diagnosis not present

## 2020-05-13 DIAGNOSIS — K219 Gastro-esophageal reflux disease without esophagitis: Secondary | ICD-10-CM | POA: Diagnosis not present

## 2020-05-13 DIAGNOSIS — E1152 Type 2 diabetes mellitus with diabetic peripheral angiopathy with gangrene: Secondary | ICD-10-CM | POA: Diagnosis not present

## 2020-05-13 DIAGNOSIS — B952 Enterococcus as the cause of diseases classified elsewhere: Secondary | ICD-10-CM | POA: Diagnosis not present

## 2020-05-13 DIAGNOSIS — I5032 Chronic diastolic (congestive) heart failure: Secondary | ICD-10-CM | POA: Diagnosis not present

## 2020-05-13 DIAGNOSIS — Z794 Long term (current) use of insulin: Secondary | ICD-10-CM | POA: Diagnosis not present

## 2020-05-13 DIAGNOSIS — R5381 Other malaise: Secondary | ICD-10-CM | POA: Diagnosis not present

## 2020-05-13 DIAGNOSIS — I272 Pulmonary hypertension, unspecified: Secondary | ICD-10-CM | POA: Diagnosis not present

## 2020-05-14 DIAGNOSIS — Z794 Long term (current) use of insulin: Secondary | ICD-10-CM | POA: Diagnosis not present

## 2020-05-14 DIAGNOSIS — B952 Enterococcus as the cause of diseases classified elsewhere: Secondary | ICD-10-CM | POA: Diagnosis not present

## 2020-05-14 DIAGNOSIS — I5032 Chronic diastolic (congestive) heart failure: Secondary | ICD-10-CM | POA: Diagnosis not present

## 2020-05-14 DIAGNOSIS — E1152 Type 2 diabetes mellitus with diabetic peripheral angiopathy with gangrene: Secondary | ICD-10-CM | POA: Diagnosis not present

## 2020-05-14 DIAGNOSIS — I2510 Atherosclerotic heart disease of native coronary artery without angina pectoris: Secondary | ICD-10-CM | POA: Diagnosis not present

## 2020-05-14 DIAGNOSIS — I272 Pulmonary hypertension, unspecified: Secondary | ICD-10-CM | POA: Diagnosis not present

## 2020-05-14 DIAGNOSIS — R5381 Other malaise: Secondary | ICD-10-CM | POA: Diagnosis not present

## 2020-05-14 DIAGNOSIS — K219 Gastro-esophageal reflux disease without esophagitis: Secondary | ICD-10-CM | POA: Diagnosis not present

## 2020-05-14 DIAGNOSIS — R7881 Bacteremia: Secondary | ICD-10-CM | POA: Diagnosis not present

## 2020-05-15 DIAGNOSIS — I272 Pulmonary hypertension, unspecified: Secondary | ICD-10-CM | POA: Diagnosis not present

## 2020-05-15 DIAGNOSIS — I2510 Atherosclerotic heart disease of native coronary artery without angina pectoris: Secondary | ICD-10-CM | POA: Diagnosis not present

## 2020-05-15 DIAGNOSIS — E1152 Type 2 diabetes mellitus with diabetic peripheral angiopathy with gangrene: Secondary | ICD-10-CM | POA: Diagnosis not present

## 2020-05-15 DIAGNOSIS — R7881 Bacteremia: Secondary | ICD-10-CM | POA: Diagnosis not present

## 2020-05-15 DIAGNOSIS — I5032 Chronic diastolic (congestive) heart failure: Secondary | ICD-10-CM | POA: Diagnosis not present

## 2020-05-15 DIAGNOSIS — B952 Enterococcus as the cause of diseases classified elsewhere: Secondary | ICD-10-CM | POA: Diagnosis not present

## 2020-05-15 DIAGNOSIS — K219 Gastro-esophageal reflux disease without esophagitis: Secondary | ICD-10-CM | POA: Diagnosis not present

## 2020-05-15 DIAGNOSIS — Z794 Long term (current) use of insulin: Secondary | ICD-10-CM | POA: Diagnosis not present

## 2020-05-15 DIAGNOSIS — R5381 Other malaise: Secondary | ICD-10-CM | POA: Diagnosis not present

## 2020-05-16 DIAGNOSIS — R7881 Bacteremia: Secondary | ICD-10-CM | POA: Diagnosis not present

## 2020-05-16 DIAGNOSIS — I5032 Chronic diastolic (congestive) heart failure: Secondary | ICD-10-CM | POA: Diagnosis not present

## 2020-05-16 DIAGNOSIS — E1152 Type 2 diabetes mellitus with diabetic peripheral angiopathy with gangrene: Secondary | ICD-10-CM | POA: Diagnosis not present

## 2020-05-16 DIAGNOSIS — Z794 Long term (current) use of insulin: Secondary | ICD-10-CM | POA: Diagnosis not present

## 2020-05-16 DIAGNOSIS — B952 Enterococcus as the cause of diseases classified elsewhere: Secondary | ICD-10-CM | POA: Diagnosis not present

## 2020-05-16 DIAGNOSIS — K219 Gastro-esophageal reflux disease without esophagitis: Secondary | ICD-10-CM | POA: Diagnosis not present

## 2020-05-16 DIAGNOSIS — I2510 Atherosclerotic heart disease of native coronary artery without angina pectoris: Secondary | ICD-10-CM | POA: Diagnosis not present

## 2020-05-16 DIAGNOSIS — R5381 Other malaise: Secondary | ICD-10-CM | POA: Diagnosis not present

## 2020-05-16 DIAGNOSIS — I272 Pulmonary hypertension, unspecified: Secondary | ICD-10-CM | POA: Diagnosis not present

## 2020-05-17 DIAGNOSIS — E1152 Type 2 diabetes mellitus with diabetic peripheral angiopathy with gangrene: Secondary | ICD-10-CM | POA: Diagnosis not present

## 2020-05-17 DIAGNOSIS — I2510 Atherosclerotic heart disease of native coronary artery without angina pectoris: Secondary | ICD-10-CM | POA: Diagnosis not present

## 2020-05-17 DIAGNOSIS — Z794 Long term (current) use of insulin: Secondary | ICD-10-CM | POA: Diagnosis not present

## 2020-05-17 DIAGNOSIS — R5381 Other malaise: Secondary | ICD-10-CM | POA: Diagnosis not present

## 2020-05-17 DIAGNOSIS — I272 Pulmonary hypertension, unspecified: Secondary | ICD-10-CM | POA: Diagnosis not present

## 2020-05-17 DIAGNOSIS — K219 Gastro-esophageal reflux disease without esophagitis: Secondary | ICD-10-CM | POA: Diagnosis not present

## 2020-05-17 DIAGNOSIS — I5032 Chronic diastolic (congestive) heart failure: Secondary | ICD-10-CM | POA: Diagnosis not present

## 2020-05-17 DIAGNOSIS — B952 Enterococcus as the cause of diseases classified elsewhere: Secondary | ICD-10-CM | POA: Diagnosis not present

## 2020-05-17 DIAGNOSIS — R7881 Bacteremia: Secondary | ICD-10-CM | POA: Diagnosis not present

## 2020-05-18 DIAGNOSIS — I2510 Atherosclerotic heart disease of native coronary artery without angina pectoris: Secondary | ICD-10-CM | POA: Diagnosis not present

## 2020-05-18 DIAGNOSIS — K219 Gastro-esophageal reflux disease without esophagitis: Secondary | ICD-10-CM | POA: Diagnosis not present

## 2020-05-18 DIAGNOSIS — R7881 Bacteremia: Secondary | ICD-10-CM | POA: Diagnosis not present

## 2020-05-18 DIAGNOSIS — I5032 Chronic diastolic (congestive) heart failure: Secondary | ICD-10-CM | POA: Diagnosis not present

## 2020-05-18 DIAGNOSIS — Z794 Long term (current) use of insulin: Secondary | ICD-10-CM | POA: Diagnosis not present

## 2020-05-18 DIAGNOSIS — R5381 Other malaise: Secondary | ICD-10-CM | POA: Diagnosis not present

## 2020-05-18 DIAGNOSIS — I272 Pulmonary hypertension, unspecified: Secondary | ICD-10-CM | POA: Diagnosis not present

## 2020-05-18 DIAGNOSIS — E1152 Type 2 diabetes mellitus with diabetic peripheral angiopathy with gangrene: Secondary | ICD-10-CM | POA: Diagnosis not present

## 2020-05-18 DIAGNOSIS — B952 Enterococcus as the cause of diseases classified elsewhere: Secondary | ICD-10-CM | POA: Diagnosis not present

## 2020-05-19 DIAGNOSIS — I5032 Chronic diastolic (congestive) heart failure: Secondary | ICD-10-CM | POA: Diagnosis not present

## 2020-05-19 DIAGNOSIS — R5381 Other malaise: Secondary | ICD-10-CM | POA: Diagnosis not present

## 2020-05-19 DIAGNOSIS — Z794 Long term (current) use of insulin: Secondary | ICD-10-CM | POA: Diagnosis not present

## 2020-05-19 DIAGNOSIS — K219 Gastro-esophageal reflux disease without esophagitis: Secondary | ICD-10-CM | POA: Diagnosis not present

## 2020-05-19 DIAGNOSIS — E1169 Type 2 diabetes mellitus with other specified complication: Secondary | ICD-10-CM | POA: Diagnosis not present

## 2020-05-19 DIAGNOSIS — B952 Enterococcus as the cause of diseases classified elsewhere: Secondary | ICD-10-CM | POA: Diagnosis not present

## 2020-05-19 DIAGNOSIS — R7881 Bacteremia: Secondary | ICD-10-CM | POA: Diagnosis not present

## 2020-05-19 DIAGNOSIS — I2510 Atherosclerotic heart disease of native coronary artery without angina pectoris: Secondary | ICD-10-CM | POA: Diagnosis not present

## 2020-05-19 DIAGNOSIS — E1152 Type 2 diabetes mellitus with diabetic peripheral angiopathy with gangrene: Secondary | ICD-10-CM | POA: Diagnosis not present

## 2020-05-20 DIAGNOSIS — R5381 Other malaise: Secondary | ICD-10-CM | POA: Diagnosis not present

## 2020-05-20 DIAGNOSIS — Z794 Long term (current) use of insulin: Secondary | ICD-10-CM | POA: Diagnosis not present

## 2020-05-20 DIAGNOSIS — K219 Gastro-esophageal reflux disease without esophagitis: Secondary | ICD-10-CM | POA: Diagnosis not present

## 2020-05-20 DIAGNOSIS — E1169 Type 2 diabetes mellitus with other specified complication: Secondary | ICD-10-CM | POA: Diagnosis not present

## 2020-05-20 DIAGNOSIS — B952 Enterococcus as the cause of diseases classified elsewhere: Secondary | ICD-10-CM | POA: Diagnosis not present

## 2020-05-20 DIAGNOSIS — E1152 Type 2 diabetes mellitus with diabetic peripheral angiopathy with gangrene: Secondary | ICD-10-CM | POA: Diagnosis not present

## 2020-05-20 DIAGNOSIS — R7881 Bacteremia: Secondary | ICD-10-CM | POA: Diagnosis not present

## 2020-05-20 DIAGNOSIS — I2510 Atherosclerotic heart disease of native coronary artery without angina pectoris: Secondary | ICD-10-CM | POA: Diagnosis not present

## 2020-05-20 DIAGNOSIS — I5032 Chronic diastolic (congestive) heart failure: Secondary | ICD-10-CM | POA: Diagnosis not present

## 2020-05-21 DIAGNOSIS — R7881 Bacteremia: Secondary | ICD-10-CM | POA: Diagnosis not present

## 2020-05-21 DIAGNOSIS — K219 Gastro-esophageal reflux disease without esophagitis: Secondary | ICD-10-CM | POA: Diagnosis not present

## 2020-05-21 DIAGNOSIS — Z89612 Acquired absence of left leg above knee: Secondary | ICD-10-CM | POA: Diagnosis not present

## 2020-05-21 DIAGNOSIS — I2510 Atherosclerotic heart disease of native coronary artery without angina pectoris: Secondary | ICD-10-CM | POA: Diagnosis not present

## 2020-05-21 DIAGNOSIS — Z794 Long term (current) use of insulin: Secondary | ICD-10-CM | POA: Diagnosis not present

## 2020-05-21 DIAGNOSIS — T8454XD Infection and inflammatory reaction due to internal left knee prosthesis, subsequent encounter: Secondary | ICD-10-CM | POA: Diagnosis not present

## 2020-05-21 DIAGNOSIS — R5381 Other malaise: Secondary | ICD-10-CM | POA: Diagnosis not present

## 2020-05-21 DIAGNOSIS — I5032 Chronic diastolic (congestive) heart failure: Secondary | ICD-10-CM | POA: Diagnosis not present

## 2020-05-21 DIAGNOSIS — E1152 Type 2 diabetes mellitus with diabetic peripheral angiopathy with gangrene: Secondary | ICD-10-CM | POA: Diagnosis not present

## 2020-05-21 DIAGNOSIS — B952 Enterococcus as the cause of diseases classified elsewhere: Secondary | ICD-10-CM | POA: Diagnosis not present

## 2020-05-21 DIAGNOSIS — E1169 Type 2 diabetes mellitus with other specified complication: Secondary | ICD-10-CM | POA: Diagnosis not present

## 2020-05-22 DIAGNOSIS — Z794 Long term (current) use of insulin: Secondary | ICD-10-CM | POA: Diagnosis not present

## 2020-05-22 DIAGNOSIS — K219 Gastro-esophageal reflux disease without esophagitis: Secondary | ICD-10-CM | POA: Diagnosis not present

## 2020-05-22 DIAGNOSIS — B952 Enterococcus as the cause of diseases classified elsewhere: Secondary | ICD-10-CM | POA: Diagnosis not present

## 2020-05-22 DIAGNOSIS — E1169 Type 2 diabetes mellitus with other specified complication: Secondary | ICD-10-CM | POA: Diagnosis not present

## 2020-05-22 DIAGNOSIS — I2510 Atherosclerotic heart disease of native coronary artery without angina pectoris: Secondary | ICD-10-CM | POA: Diagnosis not present

## 2020-05-22 DIAGNOSIS — I5032 Chronic diastolic (congestive) heart failure: Secondary | ICD-10-CM | POA: Diagnosis not present

## 2020-05-22 DIAGNOSIS — E1152 Type 2 diabetes mellitus with diabetic peripheral angiopathy with gangrene: Secondary | ICD-10-CM | POA: Diagnosis not present

## 2020-05-22 DIAGNOSIS — R5381 Other malaise: Secondary | ICD-10-CM | POA: Diagnosis not present

## 2020-05-22 DIAGNOSIS — R7881 Bacteremia: Secondary | ICD-10-CM | POA: Diagnosis not present

## 2020-05-23 DIAGNOSIS — I2510 Atherosclerotic heart disease of native coronary artery without angina pectoris: Secondary | ICD-10-CM | POA: Diagnosis not present

## 2020-05-23 DIAGNOSIS — R7881 Bacteremia: Secondary | ICD-10-CM | POA: Diagnosis not present

## 2020-05-23 DIAGNOSIS — I5032 Chronic diastolic (congestive) heart failure: Secondary | ICD-10-CM | POA: Diagnosis not present

## 2020-05-23 DIAGNOSIS — B952 Enterococcus as the cause of diseases classified elsewhere: Secondary | ICD-10-CM | POA: Diagnosis not present

## 2020-05-23 DIAGNOSIS — E1152 Type 2 diabetes mellitus with diabetic peripheral angiopathy with gangrene: Secondary | ICD-10-CM | POA: Diagnosis not present

## 2020-05-23 DIAGNOSIS — Z794 Long term (current) use of insulin: Secondary | ICD-10-CM | POA: Diagnosis not present

## 2020-05-23 DIAGNOSIS — R5381 Other malaise: Secondary | ICD-10-CM | POA: Diagnosis not present

## 2020-05-23 DIAGNOSIS — E1169 Type 2 diabetes mellitus with other specified complication: Secondary | ICD-10-CM | POA: Diagnosis not present

## 2020-05-23 DIAGNOSIS — K219 Gastro-esophageal reflux disease without esophagitis: Secondary | ICD-10-CM | POA: Diagnosis not present

## 2020-05-24 DIAGNOSIS — I5032 Chronic diastolic (congestive) heart failure: Secondary | ICD-10-CM | POA: Diagnosis not present

## 2020-05-24 DIAGNOSIS — K219 Gastro-esophageal reflux disease without esophagitis: Secondary | ICD-10-CM | POA: Diagnosis not present

## 2020-05-24 DIAGNOSIS — Z794 Long term (current) use of insulin: Secondary | ICD-10-CM | POA: Diagnosis not present

## 2020-05-24 DIAGNOSIS — E1152 Type 2 diabetes mellitus with diabetic peripheral angiopathy with gangrene: Secondary | ICD-10-CM | POA: Diagnosis not present

## 2020-05-24 DIAGNOSIS — B952 Enterococcus as the cause of diseases classified elsewhere: Secondary | ICD-10-CM | POA: Diagnosis not present

## 2020-05-24 DIAGNOSIS — R7881 Bacteremia: Secondary | ICD-10-CM | POA: Diagnosis not present

## 2020-05-24 DIAGNOSIS — E1169 Type 2 diabetes mellitus with other specified complication: Secondary | ICD-10-CM | POA: Diagnosis not present

## 2020-05-24 DIAGNOSIS — R5381 Other malaise: Secondary | ICD-10-CM | POA: Diagnosis not present

## 2020-05-24 DIAGNOSIS — I2510 Atherosclerotic heart disease of native coronary artery without angina pectoris: Secondary | ICD-10-CM | POA: Diagnosis not present

## 2020-05-25 DIAGNOSIS — R7881 Bacteremia: Secondary | ICD-10-CM | POA: Diagnosis not present

## 2020-05-25 DIAGNOSIS — B952 Enterococcus as the cause of diseases classified elsewhere: Secondary | ICD-10-CM | POA: Diagnosis not present

## 2020-05-25 DIAGNOSIS — Z794 Long term (current) use of insulin: Secondary | ICD-10-CM | POA: Diagnosis not present

## 2020-05-25 DIAGNOSIS — I5032 Chronic diastolic (congestive) heart failure: Secondary | ICD-10-CM | POA: Diagnosis not present

## 2020-05-25 DIAGNOSIS — E1152 Type 2 diabetes mellitus with diabetic peripheral angiopathy with gangrene: Secondary | ICD-10-CM | POA: Diagnosis not present

## 2020-05-25 DIAGNOSIS — K219 Gastro-esophageal reflux disease without esophagitis: Secondary | ICD-10-CM | POA: Diagnosis not present

## 2020-05-25 DIAGNOSIS — R5381 Other malaise: Secondary | ICD-10-CM | POA: Diagnosis not present

## 2020-05-25 DIAGNOSIS — E1169 Type 2 diabetes mellitus with other specified complication: Secondary | ICD-10-CM | POA: Diagnosis not present

## 2020-05-25 DIAGNOSIS — I2510 Atherosclerotic heart disease of native coronary artery without angina pectoris: Secondary | ICD-10-CM | POA: Diagnosis not present

## 2020-05-26 DIAGNOSIS — E1152 Type 2 diabetes mellitus with diabetic peripheral angiopathy with gangrene: Secondary | ICD-10-CM | POA: Diagnosis not present

## 2020-05-26 DIAGNOSIS — R7881 Bacteremia: Secondary | ICD-10-CM | POA: Diagnosis not present

## 2020-05-26 DIAGNOSIS — B952 Enterococcus as the cause of diseases classified elsewhere: Secondary | ICD-10-CM | POA: Diagnosis not present

## 2020-05-26 DIAGNOSIS — I272 Pulmonary hypertension, unspecified: Secondary | ICD-10-CM | POA: Diagnosis not present

## 2020-05-26 DIAGNOSIS — I5032 Chronic diastolic (congestive) heart failure: Secondary | ICD-10-CM | POA: Diagnosis not present

## 2020-05-26 DIAGNOSIS — K219 Gastro-esophageal reflux disease without esophagitis: Secondary | ICD-10-CM | POA: Diagnosis not present

## 2020-05-26 DIAGNOSIS — I2510 Atherosclerotic heart disease of native coronary artery without angina pectoris: Secondary | ICD-10-CM | POA: Diagnosis not present

## 2020-05-26 DIAGNOSIS — Z794 Long term (current) use of insulin: Secondary | ICD-10-CM | POA: Diagnosis not present

## 2020-05-26 DIAGNOSIS — R5381 Other malaise: Secondary | ICD-10-CM | POA: Diagnosis not present

## 2020-05-27 DIAGNOSIS — E1159 Type 2 diabetes mellitus with other circulatory complications: Secondary | ICD-10-CM | POA: Diagnosis not present

## 2020-05-27 DIAGNOSIS — E1151 Type 2 diabetes mellitus with diabetic peripheral angiopathy without gangrene: Secondary | ICD-10-CM | POA: Diagnosis not present

## 2020-05-27 DIAGNOSIS — R7881 Bacteremia: Secondary | ICD-10-CM | POA: Diagnosis not present

## 2020-05-27 DIAGNOSIS — I2510 Atherosclerotic heart disease of native coronary artery without angina pectoris: Secondary | ICD-10-CM | POA: Diagnosis not present

## 2020-05-27 DIAGNOSIS — E785 Hyperlipidemia, unspecified: Secondary | ICD-10-CM | POA: Diagnosis not present

## 2020-05-27 DIAGNOSIS — B952 Enterococcus as the cause of diseases classified elsewhere: Secondary | ICD-10-CM | POA: Diagnosis not present

## 2020-05-27 DIAGNOSIS — Z89612 Acquired absence of left leg above knee: Secondary | ICD-10-CM | POA: Diagnosis not present

## 2020-05-27 DIAGNOSIS — Z4781 Encounter for orthopedic aftercare following surgical amputation: Secondary | ICD-10-CM | POA: Diagnosis not present

## 2020-05-27 DIAGNOSIS — I152 Hypertension secondary to endocrine disorders: Secondary | ICD-10-CM | POA: Diagnosis not present

## 2020-05-28 DIAGNOSIS — I480 Paroxysmal atrial fibrillation: Secondary | ICD-10-CM | POA: Diagnosis not present

## 2020-05-28 DIAGNOSIS — I509 Heart failure, unspecified: Secondary | ICD-10-CM | POA: Diagnosis not present

## 2020-05-28 DIAGNOSIS — I442 Atrioventricular block, complete: Secondary | ICD-10-CM | POA: Diagnosis not present

## 2020-05-28 DIAGNOSIS — I428 Other cardiomyopathies: Secondary | ICD-10-CM | POA: Diagnosis not present

## 2020-06-01 DIAGNOSIS — E785 Hyperlipidemia, unspecified: Secondary | ICD-10-CM | POA: Diagnosis not present

## 2020-06-01 DIAGNOSIS — R7881 Bacteremia: Secondary | ICD-10-CM | POA: Diagnosis not present

## 2020-06-01 DIAGNOSIS — B952 Enterococcus as the cause of diseases classified elsewhere: Secondary | ICD-10-CM | POA: Diagnosis not present

## 2020-06-01 DIAGNOSIS — E1159 Type 2 diabetes mellitus with other circulatory complications: Secondary | ICD-10-CM | POA: Diagnosis not present

## 2020-06-01 DIAGNOSIS — Z89612 Acquired absence of left leg above knee: Secondary | ICD-10-CM | POA: Diagnosis not present

## 2020-06-01 DIAGNOSIS — I2510 Atherosclerotic heart disease of native coronary artery without angina pectoris: Secondary | ICD-10-CM | POA: Diagnosis not present

## 2020-06-01 DIAGNOSIS — I152 Hypertension secondary to endocrine disorders: Secondary | ICD-10-CM | POA: Diagnosis not present

## 2020-06-01 DIAGNOSIS — Z4781 Encounter for orthopedic aftercare following surgical amputation: Secondary | ICD-10-CM | POA: Diagnosis not present

## 2020-06-01 DIAGNOSIS — E1151 Type 2 diabetes mellitus with diabetic peripheral angiopathy without gangrene: Secondary | ICD-10-CM | POA: Diagnosis not present

## 2020-06-03 DIAGNOSIS — E1151 Type 2 diabetes mellitus with diabetic peripheral angiopathy without gangrene: Secondary | ICD-10-CM | POA: Diagnosis not present

## 2020-06-03 DIAGNOSIS — Z89612 Acquired absence of left leg above knee: Secondary | ICD-10-CM | POA: Diagnosis not present

## 2020-06-03 DIAGNOSIS — E785 Hyperlipidemia, unspecified: Secondary | ICD-10-CM | POA: Diagnosis not present

## 2020-06-03 DIAGNOSIS — E1159 Type 2 diabetes mellitus with other circulatory complications: Secondary | ICD-10-CM | POA: Diagnosis not present

## 2020-06-03 DIAGNOSIS — R7881 Bacteremia: Secondary | ICD-10-CM | POA: Diagnosis not present

## 2020-06-03 DIAGNOSIS — I2510 Atherosclerotic heart disease of native coronary artery without angina pectoris: Secondary | ICD-10-CM | POA: Diagnosis not present

## 2020-06-03 DIAGNOSIS — B952 Enterococcus as the cause of diseases classified elsewhere: Secondary | ICD-10-CM | POA: Diagnosis not present

## 2020-06-03 DIAGNOSIS — I152 Hypertension secondary to endocrine disorders: Secondary | ICD-10-CM | POA: Diagnosis not present

## 2020-06-03 DIAGNOSIS — Z4781 Encounter for orthopedic aftercare following surgical amputation: Secondary | ICD-10-CM | POA: Diagnosis not present

## 2020-06-08 DIAGNOSIS — R7881 Bacteremia: Secondary | ICD-10-CM | POA: Diagnosis not present

## 2020-06-08 DIAGNOSIS — I2510 Atherosclerotic heart disease of native coronary artery without angina pectoris: Secondary | ICD-10-CM | POA: Diagnosis not present

## 2020-06-08 DIAGNOSIS — E1159 Type 2 diabetes mellitus with other circulatory complications: Secondary | ICD-10-CM | POA: Diagnosis not present

## 2020-06-08 DIAGNOSIS — I152 Hypertension secondary to endocrine disorders: Secondary | ICD-10-CM | POA: Diagnosis not present

## 2020-06-08 DIAGNOSIS — Z4781 Encounter for orthopedic aftercare following surgical amputation: Secondary | ICD-10-CM | POA: Diagnosis not present

## 2020-06-08 DIAGNOSIS — Z89612 Acquired absence of left leg above knee: Secondary | ICD-10-CM | POA: Diagnosis not present

## 2020-06-08 DIAGNOSIS — E1151 Type 2 diabetes mellitus with diabetic peripheral angiopathy without gangrene: Secondary | ICD-10-CM | POA: Diagnosis not present

## 2020-06-08 DIAGNOSIS — E785 Hyperlipidemia, unspecified: Secondary | ICD-10-CM | POA: Diagnosis not present

## 2020-06-08 DIAGNOSIS — B952 Enterococcus as the cause of diseases classified elsewhere: Secondary | ICD-10-CM | POA: Diagnosis not present

## 2020-06-09 DIAGNOSIS — Z4781 Encounter for orthopedic aftercare following surgical amputation: Secondary | ICD-10-CM | POA: Diagnosis not present

## 2020-06-09 DIAGNOSIS — Z89612 Acquired absence of left leg above knee: Secondary | ICD-10-CM | POA: Diagnosis not present

## 2020-06-09 DIAGNOSIS — I152 Hypertension secondary to endocrine disorders: Secondary | ICD-10-CM | POA: Diagnosis not present

## 2020-06-09 DIAGNOSIS — E1159 Type 2 diabetes mellitus with other circulatory complications: Secondary | ICD-10-CM | POA: Diagnosis not present

## 2020-06-09 DIAGNOSIS — B952 Enterococcus as the cause of diseases classified elsewhere: Secondary | ICD-10-CM | POA: Diagnosis not present

## 2020-06-09 DIAGNOSIS — I2510 Atherosclerotic heart disease of native coronary artery without angina pectoris: Secondary | ICD-10-CM | POA: Diagnosis not present

## 2020-06-09 DIAGNOSIS — E1151 Type 2 diabetes mellitus with diabetic peripheral angiopathy without gangrene: Secondary | ICD-10-CM | POA: Diagnosis not present

## 2020-06-09 DIAGNOSIS — E785 Hyperlipidemia, unspecified: Secondary | ICD-10-CM | POA: Diagnosis not present

## 2020-06-09 DIAGNOSIS — R7881 Bacteremia: Secondary | ICD-10-CM | POA: Diagnosis not present

## 2020-06-15 DIAGNOSIS — I2510 Atherosclerotic heart disease of native coronary artery without angina pectoris: Secondary | ICD-10-CM | POA: Diagnosis not present

## 2020-06-15 DIAGNOSIS — E1151 Type 2 diabetes mellitus with diabetic peripheral angiopathy without gangrene: Secondary | ICD-10-CM | POA: Diagnosis not present

## 2020-06-15 DIAGNOSIS — E785 Hyperlipidemia, unspecified: Secondary | ICD-10-CM | POA: Diagnosis not present

## 2020-06-15 DIAGNOSIS — E1159 Type 2 diabetes mellitus with other circulatory complications: Secondary | ICD-10-CM | POA: Diagnosis not present

## 2020-06-15 DIAGNOSIS — B952 Enterococcus as the cause of diseases classified elsewhere: Secondary | ICD-10-CM | POA: Diagnosis not present

## 2020-06-15 DIAGNOSIS — I152 Hypertension secondary to endocrine disorders: Secondary | ICD-10-CM | POA: Diagnosis not present

## 2020-06-15 DIAGNOSIS — Z89612 Acquired absence of left leg above knee: Secondary | ICD-10-CM | POA: Diagnosis not present

## 2020-06-15 DIAGNOSIS — Z4781 Encounter for orthopedic aftercare following surgical amputation: Secondary | ICD-10-CM | POA: Diagnosis not present

## 2020-06-15 DIAGNOSIS — R7881 Bacteremia: Secondary | ICD-10-CM | POA: Diagnosis not present

## 2020-06-17 DIAGNOSIS — E1165 Type 2 diabetes mellitus with hyperglycemia: Secondary | ICD-10-CM | POA: Diagnosis not present

## 2020-06-18 DIAGNOSIS — Z4781 Encounter for orthopedic aftercare following surgical amputation: Secondary | ICD-10-CM | POA: Diagnosis not present

## 2020-06-18 DIAGNOSIS — E785 Hyperlipidemia, unspecified: Secondary | ICD-10-CM | POA: Diagnosis not present

## 2020-06-18 DIAGNOSIS — I2510 Atherosclerotic heart disease of native coronary artery without angina pectoris: Secondary | ICD-10-CM | POA: Diagnosis not present

## 2020-06-18 DIAGNOSIS — Z89612 Acquired absence of left leg above knee: Secondary | ICD-10-CM | POA: Diagnosis not present

## 2020-06-18 DIAGNOSIS — E1151 Type 2 diabetes mellitus with diabetic peripheral angiopathy without gangrene: Secondary | ICD-10-CM | POA: Diagnosis not present

## 2020-06-18 DIAGNOSIS — R7881 Bacteremia: Secondary | ICD-10-CM | POA: Diagnosis not present

## 2020-06-18 DIAGNOSIS — I152 Hypertension secondary to endocrine disorders: Secondary | ICD-10-CM | POA: Diagnosis not present

## 2020-06-18 DIAGNOSIS — B952 Enterococcus as the cause of diseases classified elsewhere: Secondary | ICD-10-CM | POA: Diagnosis not present

## 2020-06-18 DIAGNOSIS — E1159 Type 2 diabetes mellitus with other circulatory complications: Secondary | ICD-10-CM | POA: Diagnosis not present

## 2020-06-23 DIAGNOSIS — I50810 Right heart failure, unspecified: Secondary | ICD-10-CM | POA: Diagnosis not present

## 2020-06-23 DIAGNOSIS — Z952 Presence of prosthetic heart valve: Secondary | ICD-10-CM | POA: Diagnosis not present

## 2020-06-23 DIAGNOSIS — I10 Essential (primary) hypertension: Secondary | ICD-10-CM | POA: Diagnosis not present

## 2020-06-23 DIAGNOSIS — N189 Chronic kidney disease, unspecified: Secondary | ICD-10-CM | POA: Diagnosis not present

## 2020-06-23 DIAGNOSIS — G2581 Restless legs syndrome: Secondary | ICD-10-CM | POA: Diagnosis not present

## 2020-06-23 DIAGNOSIS — N401 Enlarged prostate with lower urinary tract symptoms: Secondary | ICD-10-CM | POA: Diagnosis not present

## 2020-06-23 DIAGNOSIS — M5459 Other low back pain: Secondary | ICD-10-CM | POA: Diagnosis not present

## 2020-06-23 DIAGNOSIS — D649 Anemia, unspecified: Secondary | ICD-10-CM | POA: Diagnosis not present

## 2020-06-23 DIAGNOSIS — Z79899 Other long term (current) drug therapy: Secondary | ICD-10-CM | POA: Diagnosis not present

## 2020-06-25 DIAGNOSIS — I152 Hypertension secondary to endocrine disorders: Secondary | ICD-10-CM | POA: Diagnosis not present

## 2020-06-25 DIAGNOSIS — Z89612 Acquired absence of left leg above knee: Secondary | ICD-10-CM | POA: Diagnosis not present

## 2020-06-25 DIAGNOSIS — E785 Hyperlipidemia, unspecified: Secondary | ICD-10-CM | POA: Diagnosis not present

## 2020-06-25 DIAGNOSIS — B952 Enterococcus as the cause of diseases classified elsewhere: Secondary | ICD-10-CM | POA: Diagnosis not present

## 2020-06-25 DIAGNOSIS — E1159 Type 2 diabetes mellitus with other circulatory complications: Secondary | ICD-10-CM | POA: Diagnosis not present

## 2020-06-25 DIAGNOSIS — Z4781 Encounter for orthopedic aftercare following surgical amputation: Secondary | ICD-10-CM | POA: Diagnosis not present

## 2020-06-25 DIAGNOSIS — R7881 Bacteremia: Secondary | ICD-10-CM | POA: Diagnosis not present

## 2020-06-25 DIAGNOSIS — I2510 Atherosclerotic heart disease of native coronary artery without angina pectoris: Secondary | ICD-10-CM | POA: Diagnosis not present

## 2020-06-25 DIAGNOSIS — E1151 Type 2 diabetes mellitus with diabetic peripheral angiopathy without gangrene: Secondary | ICD-10-CM | POA: Diagnosis not present

## 2020-06-26 DIAGNOSIS — I739 Peripheral vascular disease, unspecified: Secondary | ICD-10-CM | POA: Diagnosis not present

## 2020-06-26 DIAGNOSIS — I5032 Chronic diastolic (congestive) heart failure: Secondary | ICD-10-CM | POA: Diagnosis not present

## 2020-06-26 DIAGNOSIS — I270 Primary pulmonary hypertension: Secondary | ICD-10-CM | POA: Diagnosis not present

## 2020-07-20 DIAGNOSIS — Z89612 Acquired absence of left leg above knee: Secondary | ICD-10-CM | POA: Diagnosis not present

## 2020-07-21 DIAGNOSIS — E118 Type 2 diabetes mellitus with unspecified complications: Secondary | ICD-10-CM | POA: Diagnosis not present

## 2020-07-21 DIAGNOSIS — M5459 Other low back pain: Secondary | ICD-10-CM | POA: Diagnosis not present

## 2020-07-24 DIAGNOSIS — Z794 Long term (current) use of insulin: Secondary | ICD-10-CM | POA: Diagnosis not present

## 2020-07-24 DIAGNOSIS — E1122 Type 2 diabetes mellitus with diabetic chronic kidney disease: Secondary | ICD-10-CM | POA: Diagnosis not present

## 2020-07-24 DIAGNOSIS — I5032 Chronic diastolic (congestive) heart failure: Secondary | ICD-10-CM | POA: Diagnosis not present

## 2020-07-24 DIAGNOSIS — E1142 Type 2 diabetes mellitus with diabetic polyneuropathy: Secondary | ICD-10-CM | POA: Diagnosis not present

## 2020-07-24 DIAGNOSIS — I2510 Atherosclerotic heart disease of native coronary artery without angina pectoris: Secondary | ICD-10-CM | POA: Diagnosis not present

## 2020-07-24 DIAGNOSIS — E1151 Type 2 diabetes mellitus with diabetic peripheral angiopathy without gangrene: Secondary | ICD-10-CM | POA: Diagnosis not present

## 2020-07-24 DIAGNOSIS — N1832 Chronic kidney disease, stage 3b: Secondary | ICD-10-CM | POA: Diagnosis not present

## 2020-07-24 DIAGNOSIS — I270 Primary pulmonary hypertension: Secondary | ICD-10-CM | POA: Diagnosis not present

## 2020-07-24 DIAGNOSIS — I739 Peripheral vascular disease, unspecified: Secondary | ICD-10-CM | POA: Diagnosis not present

## 2020-07-24 DIAGNOSIS — Z89612 Acquired absence of left leg above knee: Secondary | ICD-10-CM | POA: Diagnosis not present

## 2020-07-30 DIAGNOSIS — E1122 Type 2 diabetes mellitus with diabetic chronic kidney disease: Secondary | ICD-10-CM | POA: Diagnosis not present

## 2020-07-30 DIAGNOSIS — Z4781 Encounter for orthopedic aftercare following surgical amputation: Secondary | ICD-10-CM | POA: Diagnosis not present

## 2020-07-30 DIAGNOSIS — I50810 Right heart failure, unspecified: Secondary | ICD-10-CM | POA: Diagnosis not present

## 2020-07-30 DIAGNOSIS — Z44122 Encounter for fitting and adjustment of partial artificial left leg: Secondary | ICD-10-CM | POA: Diagnosis not present

## 2020-07-30 DIAGNOSIS — N183 Chronic kidney disease, stage 3 unspecified: Secondary | ICD-10-CM | POA: Diagnosis not present

## 2020-07-30 DIAGNOSIS — I152 Hypertension secondary to endocrine disorders: Secondary | ICD-10-CM | POA: Diagnosis not present

## 2020-07-30 DIAGNOSIS — E1159 Type 2 diabetes mellitus with other circulatory complications: Secondary | ICD-10-CM | POA: Diagnosis not present

## 2020-07-30 DIAGNOSIS — E1151 Type 2 diabetes mellitus with diabetic peripheral angiopathy without gangrene: Secondary | ICD-10-CM | POA: Diagnosis not present

## 2020-07-30 DIAGNOSIS — I5032 Chronic diastolic (congestive) heart failure: Secondary | ICD-10-CM | POA: Diagnosis not present

## 2020-07-31 DIAGNOSIS — E1165 Type 2 diabetes mellitus with hyperglycemia: Secondary | ICD-10-CM | POA: Diagnosis not present

## 2020-08-03 DIAGNOSIS — I5032 Chronic diastolic (congestive) heart failure: Secondary | ICD-10-CM | POA: Diagnosis not present

## 2020-08-03 DIAGNOSIS — E1122 Type 2 diabetes mellitus with diabetic chronic kidney disease: Secondary | ICD-10-CM | POA: Diagnosis not present

## 2020-08-03 DIAGNOSIS — Z4781 Encounter for orthopedic aftercare following surgical amputation: Secondary | ICD-10-CM | POA: Diagnosis not present

## 2020-08-03 DIAGNOSIS — E1159 Type 2 diabetes mellitus with other circulatory complications: Secondary | ICD-10-CM | POA: Diagnosis not present

## 2020-08-03 DIAGNOSIS — N183 Chronic kidney disease, stage 3 unspecified: Secondary | ICD-10-CM | POA: Diagnosis not present

## 2020-08-03 DIAGNOSIS — I152 Hypertension secondary to endocrine disorders: Secondary | ICD-10-CM | POA: Diagnosis not present

## 2020-08-03 DIAGNOSIS — Z44122 Encounter for fitting and adjustment of partial artificial left leg: Secondary | ICD-10-CM | POA: Diagnosis not present

## 2020-08-03 DIAGNOSIS — E1151 Type 2 diabetes mellitus with diabetic peripheral angiopathy without gangrene: Secondary | ICD-10-CM | POA: Diagnosis not present

## 2020-08-03 DIAGNOSIS — I50810 Right heart failure, unspecified: Secondary | ICD-10-CM | POA: Diagnosis not present

## 2020-08-05 DIAGNOSIS — I5032 Chronic diastolic (congestive) heart failure: Secondary | ICD-10-CM | POA: Diagnosis not present

## 2020-08-05 DIAGNOSIS — I152 Hypertension secondary to endocrine disorders: Secondary | ICD-10-CM | POA: Diagnosis not present

## 2020-08-05 DIAGNOSIS — E1122 Type 2 diabetes mellitus with diabetic chronic kidney disease: Secondary | ICD-10-CM | POA: Diagnosis not present

## 2020-08-05 DIAGNOSIS — N183 Chronic kidney disease, stage 3 unspecified: Secondary | ICD-10-CM | POA: Diagnosis not present

## 2020-08-05 DIAGNOSIS — E1151 Type 2 diabetes mellitus with diabetic peripheral angiopathy without gangrene: Secondary | ICD-10-CM | POA: Diagnosis not present

## 2020-08-05 DIAGNOSIS — Z44122 Encounter for fitting and adjustment of partial artificial left leg: Secondary | ICD-10-CM | POA: Diagnosis not present

## 2020-08-05 DIAGNOSIS — E1159 Type 2 diabetes mellitus with other circulatory complications: Secondary | ICD-10-CM | POA: Diagnosis not present

## 2020-08-05 DIAGNOSIS — I50810 Right heart failure, unspecified: Secondary | ICD-10-CM | POA: Diagnosis not present

## 2020-08-05 DIAGNOSIS — Z4781 Encounter for orthopedic aftercare following surgical amputation: Secondary | ICD-10-CM | POA: Diagnosis not present

## 2020-08-07 DIAGNOSIS — E1151 Type 2 diabetes mellitus with diabetic peripheral angiopathy without gangrene: Secondary | ICD-10-CM | POA: Diagnosis not present

## 2020-08-07 DIAGNOSIS — E1159 Type 2 diabetes mellitus with other circulatory complications: Secondary | ICD-10-CM | POA: Diagnosis not present

## 2020-08-07 DIAGNOSIS — E1122 Type 2 diabetes mellitus with diabetic chronic kidney disease: Secondary | ICD-10-CM | POA: Diagnosis not present

## 2020-08-07 DIAGNOSIS — I50810 Right heart failure, unspecified: Secondary | ICD-10-CM | POA: Diagnosis not present

## 2020-08-07 DIAGNOSIS — Z44122 Encounter for fitting and adjustment of partial artificial left leg: Secondary | ICD-10-CM | POA: Diagnosis not present

## 2020-08-07 DIAGNOSIS — N183 Chronic kidney disease, stage 3 unspecified: Secondary | ICD-10-CM | POA: Diagnosis not present

## 2020-08-07 DIAGNOSIS — I152 Hypertension secondary to endocrine disorders: Secondary | ICD-10-CM | POA: Diagnosis not present

## 2020-08-07 DIAGNOSIS — I5032 Chronic diastolic (congestive) heart failure: Secondary | ICD-10-CM | POA: Diagnosis not present

## 2020-08-07 DIAGNOSIS — Z4781 Encounter for orthopedic aftercare following surgical amputation: Secondary | ICD-10-CM | POA: Diagnosis not present

## 2020-08-10 DIAGNOSIS — E1122 Type 2 diabetes mellitus with diabetic chronic kidney disease: Secondary | ICD-10-CM | POA: Diagnosis not present

## 2020-08-10 DIAGNOSIS — E1151 Type 2 diabetes mellitus with diabetic peripheral angiopathy without gangrene: Secondary | ICD-10-CM | POA: Diagnosis not present

## 2020-08-10 DIAGNOSIS — E1159 Type 2 diabetes mellitus with other circulatory complications: Secondary | ICD-10-CM | POA: Diagnosis not present

## 2020-08-10 DIAGNOSIS — N183 Chronic kidney disease, stage 3 unspecified: Secondary | ICD-10-CM | POA: Diagnosis not present

## 2020-08-10 DIAGNOSIS — I5032 Chronic diastolic (congestive) heart failure: Secondary | ICD-10-CM | POA: Diagnosis not present

## 2020-08-10 DIAGNOSIS — Z4781 Encounter for orthopedic aftercare following surgical amputation: Secondary | ICD-10-CM | POA: Diagnosis not present

## 2020-08-10 DIAGNOSIS — I50810 Right heart failure, unspecified: Secondary | ICD-10-CM | POA: Diagnosis not present

## 2020-08-10 DIAGNOSIS — Z44122 Encounter for fitting and adjustment of partial artificial left leg: Secondary | ICD-10-CM | POA: Diagnosis not present

## 2020-08-10 DIAGNOSIS — I152 Hypertension secondary to endocrine disorders: Secondary | ICD-10-CM | POA: Diagnosis not present

## 2020-08-12 DIAGNOSIS — N183 Chronic kidney disease, stage 3 unspecified: Secondary | ICD-10-CM | POA: Diagnosis not present

## 2020-08-12 DIAGNOSIS — E1151 Type 2 diabetes mellitus with diabetic peripheral angiopathy without gangrene: Secondary | ICD-10-CM | POA: Diagnosis not present

## 2020-08-12 DIAGNOSIS — I5032 Chronic diastolic (congestive) heart failure: Secondary | ICD-10-CM | POA: Diagnosis not present

## 2020-08-12 DIAGNOSIS — I50810 Right heart failure, unspecified: Secondary | ICD-10-CM | POA: Diagnosis not present

## 2020-08-12 DIAGNOSIS — I152 Hypertension secondary to endocrine disorders: Secondary | ICD-10-CM | POA: Diagnosis not present

## 2020-08-12 DIAGNOSIS — Z4781 Encounter for orthopedic aftercare following surgical amputation: Secondary | ICD-10-CM | POA: Diagnosis not present

## 2020-08-12 DIAGNOSIS — E1122 Type 2 diabetes mellitus with diabetic chronic kidney disease: Secondary | ICD-10-CM | POA: Diagnosis not present

## 2020-08-12 DIAGNOSIS — E1159 Type 2 diabetes mellitus with other circulatory complications: Secondary | ICD-10-CM | POA: Diagnosis not present

## 2020-08-12 DIAGNOSIS — Z44122 Encounter for fitting and adjustment of partial artificial left leg: Secondary | ICD-10-CM | POA: Diagnosis not present

## 2020-08-14 DIAGNOSIS — Z4781 Encounter for orthopedic aftercare following surgical amputation: Secondary | ICD-10-CM | POA: Diagnosis not present

## 2020-08-14 DIAGNOSIS — I152 Hypertension secondary to endocrine disorders: Secondary | ICD-10-CM | POA: Diagnosis not present

## 2020-08-14 DIAGNOSIS — E1159 Type 2 diabetes mellitus with other circulatory complications: Secondary | ICD-10-CM | POA: Diagnosis not present

## 2020-08-14 DIAGNOSIS — I50810 Right heart failure, unspecified: Secondary | ICD-10-CM | POA: Diagnosis not present

## 2020-08-14 DIAGNOSIS — E1151 Type 2 diabetes mellitus with diabetic peripheral angiopathy without gangrene: Secondary | ICD-10-CM | POA: Diagnosis not present

## 2020-08-14 DIAGNOSIS — Z44122 Encounter for fitting and adjustment of partial artificial left leg: Secondary | ICD-10-CM | POA: Diagnosis not present

## 2020-08-14 DIAGNOSIS — I5032 Chronic diastolic (congestive) heart failure: Secondary | ICD-10-CM | POA: Diagnosis not present

## 2020-08-14 DIAGNOSIS — E1122 Type 2 diabetes mellitus with diabetic chronic kidney disease: Secondary | ICD-10-CM | POA: Diagnosis not present

## 2020-08-14 DIAGNOSIS — N183 Chronic kidney disease, stage 3 unspecified: Secondary | ICD-10-CM | POA: Diagnosis not present

## 2020-08-17 DIAGNOSIS — N183 Chronic kidney disease, stage 3 unspecified: Secondary | ICD-10-CM | POA: Diagnosis not present

## 2020-08-17 DIAGNOSIS — I50810 Right heart failure, unspecified: Secondary | ICD-10-CM | POA: Diagnosis not present

## 2020-08-17 DIAGNOSIS — I5032 Chronic diastolic (congestive) heart failure: Secondary | ICD-10-CM | POA: Diagnosis not present

## 2020-08-17 DIAGNOSIS — Z44122 Encounter for fitting and adjustment of partial artificial left leg: Secondary | ICD-10-CM | POA: Diagnosis not present

## 2020-08-17 DIAGNOSIS — E1159 Type 2 diabetes mellitus with other circulatory complications: Secondary | ICD-10-CM | POA: Diagnosis not present

## 2020-08-17 DIAGNOSIS — E1151 Type 2 diabetes mellitus with diabetic peripheral angiopathy without gangrene: Secondary | ICD-10-CM | POA: Diagnosis not present

## 2020-08-17 DIAGNOSIS — Z4781 Encounter for orthopedic aftercare following surgical amputation: Secondary | ICD-10-CM | POA: Diagnosis not present

## 2020-08-17 DIAGNOSIS — E1122 Type 2 diabetes mellitus with diabetic chronic kidney disease: Secondary | ICD-10-CM | POA: Diagnosis not present

## 2020-08-17 DIAGNOSIS — I152 Hypertension secondary to endocrine disorders: Secondary | ICD-10-CM | POA: Diagnosis not present

## 2020-08-19 DIAGNOSIS — I5032 Chronic diastolic (congestive) heart failure: Secondary | ICD-10-CM | POA: Diagnosis not present

## 2020-08-19 DIAGNOSIS — Z44122 Encounter for fitting and adjustment of partial artificial left leg: Secondary | ICD-10-CM | POA: Diagnosis not present

## 2020-08-19 DIAGNOSIS — I50810 Right heart failure, unspecified: Secondary | ICD-10-CM | POA: Diagnosis not present

## 2020-08-19 DIAGNOSIS — N183 Chronic kidney disease, stage 3 unspecified: Secondary | ICD-10-CM | POA: Diagnosis not present

## 2020-08-19 DIAGNOSIS — I152 Hypertension secondary to endocrine disorders: Secondary | ICD-10-CM | POA: Diagnosis not present

## 2020-08-19 DIAGNOSIS — Z4781 Encounter for orthopedic aftercare following surgical amputation: Secondary | ICD-10-CM | POA: Diagnosis not present

## 2020-08-19 DIAGNOSIS — E1122 Type 2 diabetes mellitus with diabetic chronic kidney disease: Secondary | ICD-10-CM | POA: Diagnosis not present

## 2020-08-19 DIAGNOSIS — E1151 Type 2 diabetes mellitus with diabetic peripheral angiopathy without gangrene: Secondary | ICD-10-CM | POA: Diagnosis not present

## 2020-08-19 DIAGNOSIS — E1159 Type 2 diabetes mellitus with other circulatory complications: Secondary | ICD-10-CM | POA: Diagnosis not present

## 2020-08-24 DIAGNOSIS — E1151 Type 2 diabetes mellitus with diabetic peripheral angiopathy without gangrene: Secondary | ICD-10-CM | POA: Diagnosis not present

## 2020-08-24 DIAGNOSIS — Z139 Encounter for screening, unspecified: Secondary | ICD-10-CM | POA: Diagnosis not present

## 2020-08-24 DIAGNOSIS — Z952 Presence of prosthetic heart valve: Secondary | ICD-10-CM | POA: Diagnosis not present

## 2020-08-24 DIAGNOSIS — Z4781 Encounter for orthopedic aftercare following surgical amputation: Secondary | ICD-10-CM | POA: Diagnosis not present

## 2020-08-24 DIAGNOSIS — E118 Type 2 diabetes mellitus with unspecified complications: Secondary | ICD-10-CM | POA: Diagnosis not present

## 2020-08-24 DIAGNOSIS — E1159 Type 2 diabetes mellitus with other circulatory complications: Secondary | ICD-10-CM | POA: Diagnosis not present

## 2020-08-24 DIAGNOSIS — Z44122 Encounter for fitting and adjustment of partial artificial left leg: Secondary | ICD-10-CM | POA: Diagnosis not present

## 2020-08-24 DIAGNOSIS — M5459 Other low back pain: Secondary | ICD-10-CM | POA: Diagnosis not present

## 2020-08-24 DIAGNOSIS — N189 Chronic kidney disease, unspecified: Secondary | ICD-10-CM | POA: Diagnosis not present

## 2020-08-24 DIAGNOSIS — I152 Hypertension secondary to endocrine disorders: Secondary | ICD-10-CM | POA: Diagnosis not present

## 2020-08-24 DIAGNOSIS — Z1331 Encounter for screening for depression: Secondary | ICD-10-CM | POA: Diagnosis not present

## 2020-08-24 DIAGNOSIS — I270 Primary pulmonary hypertension: Secondary | ICD-10-CM | POA: Diagnosis not present

## 2020-08-24 DIAGNOSIS — N183 Chronic kidney disease, stage 3 unspecified: Secondary | ICD-10-CM | POA: Diagnosis not present

## 2020-08-24 DIAGNOSIS — I50810 Right heart failure, unspecified: Secondary | ICD-10-CM | POA: Diagnosis not present

## 2020-08-24 DIAGNOSIS — E1122 Type 2 diabetes mellitus with diabetic chronic kidney disease: Secondary | ICD-10-CM | POA: Diagnosis not present

## 2020-08-24 DIAGNOSIS — Z9181 History of falling: Secondary | ICD-10-CM | POA: Diagnosis not present

## 2020-08-24 DIAGNOSIS — Z950 Presence of cardiac pacemaker: Secondary | ICD-10-CM | POA: Diagnosis not present

## 2020-08-24 DIAGNOSIS — I739 Peripheral vascular disease, unspecified: Secondary | ICD-10-CM | POA: Diagnosis not present

## 2020-08-24 DIAGNOSIS — I5032 Chronic diastolic (congestive) heart failure: Secondary | ICD-10-CM | POA: Diagnosis not present

## 2020-08-24 DIAGNOSIS — I10 Essential (primary) hypertension: Secondary | ICD-10-CM | POA: Diagnosis not present

## 2020-08-28 DIAGNOSIS — E1159 Type 2 diabetes mellitus with other circulatory complications: Secondary | ICD-10-CM | POA: Diagnosis not present

## 2020-08-28 DIAGNOSIS — Z4781 Encounter for orthopedic aftercare following surgical amputation: Secondary | ICD-10-CM | POA: Diagnosis not present

## 2020-08-28 DIAGNOSIS — I5032 Chronic diastolic (congestive) heart failure: Secondary | ICD-10-CM | POA: Diagnosis not present

## 2020-08-28 DIAGNOSIS — I152 Hypertension secondary to endocrine disorders: Secondary | ICD-10-CM | POA: Diagnosis not present

## 2020-08-28 DIAGNOSIS — E1122 Type 2 diabetes mellitus with diabetic chronic kidney disease: Secondary | ICD-10-CM | POA: Diagnosis not present

## 2020-08-28 DIAGNOSIS — I50810 Right heart failure, unspecified: Secondary | ICD-10-CM | POA: Diagnosis not present

## 2020-08-28 DIAGNOSIS — N183 Chronic kidney disease, stage 3 unspecified: Secondary | ICD-10-CM | POA: Diagnosis not present

## 2020-08-28 DIAGNOSIS — E1151 Type 2 diabetes mellitus with diabetic peripheral angiopathy without gangrene: Secondary | ICD-10-CM | POA: Diagnosis not present

## 2020-08-28 DIAGNOSIS — Z44122 Encounter for fitting and adjustment of partial artificial left leg: Secondary | ICD-10-CM | POA: Diagnosis not present

## 2020-08-31 DIAGNOSIS — E1165 Type 2 diabetes mellitus with hyperglycemia: Secondary | ICD-10-CM | POA: Diagnosis not present

## 2020-08-31 DIAGNOSIS — I428 Other cardiomyopathies: Secondary | ICD-10-CM | POA: Diagnosis not present

## 2020-08-31 DIAGNOSIS — I442 Atrioventricular block, complete: Secondary | ICD-10-CM | POA: Diagnosis not present

## 2020-08-31 DIAGNOSIS — I480 Paroxysmal atrial fibrillation: Secondary | ICD-10-CM | POA: Diagnosis not present

## 2020-08-31 DIAGNOSIS — I509 Heart failure, unspecified: Secondary | ICD-10-CM | POA: Diagnosis not present

## 2020-09-01 DIAGNOSIS — I50810 Right heart failure, unspecified: Secondary | ICD-10-CM | POA: Diagnosis not present

## 2020-09-01 DIAGNOSIS — E1151 Type 2 diabetes mellitus with diabetic peripheral angiopathy without gangrene: Secondary | ICD-10-CM | POA: Diagnosis not present

## 2020-09-01 DIAGNOSIS — Z4781 Encounter for orthopedic aftercare following surgical amputation: Secondary | ICD-10-CM | POA: Diagnosis not present

## 2020-09-01 DIAGNOSIS — I152 Hypertension secondary to endocrine disorders: Secondary | ICD-10-CM | POA: Diagnosis not present

## 2020-09-01 DIAGNOSIS — E1159 Type 2 diabetes mellitus with other circulatory complications: Secondary | ICD-10-CM | POA: Diagnosis not present

## 2020-09-01 DIAGNOSIS — I5032 Chronic diastolic (congestive) heart failure: Secondary | ICD-10-CM | POA: Diagnosis not present

## 2020-09-01 DIAGNOSIS — Z44122 Encounter for fitting and adjustment of partial artificial left leg: Secondary | ICD-10-CM | POA: Diagnosis not present

## 2020-09-01 DIAGNOSIS — E1122 Type 2 diabetes mellitus with diabetic chronic kidney disease: Secondary | ICD-10-CM | POA: Diagnosis not present

## 2020-09-01 DIAGNOSIS — N183 Chronic kidney disease, stage 3 unspecified: Secondary | ICD-10-CM | POA: Diagnosis not present

## 2020-09-03 DIAGNOSIS — Z44122 Encounter for fitting and adjustment of partial artificial left leg: Secondary | ICD-10-CM | POA: Diagnosis not present

## 2020-09-03 DIAGNOSIS — Z4781 Encounter for orthopedic aftercare following surgical amputation: Secondary | ICD-10-CM | POA: Diagnosis not present

## 2020-09-03 DIAGNOSIS — I50810 Right heart failure, unspecified: Secondary | ICD-10-CM | POA: Diagnosis not present

## 2020-09-03 DIAGNOSIS — E1151 Type 2 diabetes mellitus with diabetic peripheral angiopathy without gangrene: Secondary | ICD-10-CM | POA: Diagnosis not present

## 2020-09-03 DIAGNOSIS — E1122 Type 2 diabetes mellitus with diabetic chronic kidney disease: Secondary | ICD-10-CM | POA: Diagnosis not present

## 2020-09-03 DIAGNOSIS — N183 Chronic kidney disease, stage 3 unspecified: Secondary | ICD-10-CM | POA: Diagnosis not present

## 2020-09-03 DIAGNOSIS — I5032 Chronic diastolic (congestive) heart failure: Secondary | ICD-10-CM | POA: Diagnosis not present

## 2020-09-03 DIAGNOSIS — E1159 Type 2 diabetes mellitus with other circulatory complications: Secondary | ICD-10-CM | POA: Diagnosis not present

## 2020-09-03 DIAGNOSIS — I152 Hypertension secondary to endocrine disorders: Secondary | ICD-10-CM | POA: Diagnosis not present

## 2020-09-04 DIAGNOSIS — K219 Gastro-esophageal reflux disease without esophagitis: Secondary | ICD-10-CM | POA: Diagnosis not present

## 2020-09-04 DIAGNOSIS — B952 Enterococcus as the cause of diseases classified elsewhere: Secondary | ICD-10-CM | POA: Diagnosis not present

## 2020-09-04 DIAGNOSIS — G2581 Restless legs syndrome: Secondary | ICD-10-CM | POA: Diagnosis not present

## 2020-09-04 DIAGNOSIS — R7881 Bacteremia: Secondary | ICD-10-CM | POA: Diagnosis not present

## 2020-09-04 DIAGNOSIS — E118 Type 2 diabetes mellitus with unspecified complications: Secondary | ICD-10-CM | POA: Diagnosis not present

## 2020-09-04 DIAGNOSIS — N401 Enlarged prostate with lower urinary tract symptoms: Secondary | ICD-10-CM | POA: Diagnosis not present

## 2020-09-04 DIAGNOSIS — M5382 Other specified dorsopathies, cervical region: Secondary | ICD-10-CM | POA: Diagnosis not present

## 2020-09-04 DIAGNOSIS — L02212 Cutaneous abscess of back [any part, except buttock]: Secondary | ICD-10-CM | POA: Diagnosis not present

## 2020-09-04 DIAGNOSIS — D649 Anemia, unspecified: Secondary | ICD-10-CM | POA: Diagnosis not present

## 2020-09-04 DIAGNOSIS — E785 Hyperlipidemia, unspecified: Secondary | ICD-10-CM | POA: Diagnosis not present

## 2020-09-08 DIAGNOSIS — N183 Chronic kidney disease, stage 3 unspecified: Secondary | ICD-10-CM | POA: Diagnosis not present

## 2020-09-08 DIAGNOSIS — Z44122 Encounter for fitting and adjustment of partial artificial left leg: Secondary | ICD-10-CM | POA: Diagnosis not present

## 2020-09-08 DIAGNOSIS — I5032 Chronic diastolic (congestive) heart failure: Secondary | ICD-10-CM | POA: Diagnosis not present

## 2020-09-08 DIAGNOSIS — E1122 Type 2 diabetes mellitus with diabetic chronic kidney disease: Secondary | ICD-10-CM | POA: Diagnosis not present

## 2020-09-08 DIAGNOSIS — Z4781 Encounter for orthopedic aftercare following surgical amputation: Secondary | ICD-10-CM | POA: Diagnosis not present

## 2020-09-08 DIAGNOSIS — E1159 Type 2 diabetes mellitus with other circulatory complications: Secondary | ICD-10-CM | POA: Diagnosis not present

## 2020-09-08 DIAGNOSIS — E1151 Type 2 diabetes mellitus with diabetic peripheral angiopathy without gangrene: Secondary | ICD-10-CM | POA: Diagnosis not present

## 2020-09-08 DIAGNOSIS — I152 Hypertension secondary to endocrine disorders: Secondary | ICD-10-CM | POA: Diagnosis not present

## 2020-09-08 DIAGNOSIS — I50810 Right heart failure, unspecified: Secondary | ICD-10-CM | POA: Diagnosis not present

## 2020-09-11 DIAGNOSIS — Z44122 Encounter for fitting and adjustment of partial artificial left leg: Secondary | ICD-10-CM | POA: Diagnosis not present

## 2020-09-11 DIAGNOSIS — N183 Chronic kidney disease, stage 3 unspecified: Secondary | ICD-10-CM | POA: Diagnosis not present

## 2020-09-11 DIAGNOSIS — I5032 Chronic diastolic (congestive) heart failure: Secondary | ICD-10-CM | POA: Diagnosis not present

## 2020-09-11 DIAGNOSIS — E1122 Type 2 diabetes mellitus with diabetic chronic kidney disease: Secondary | ICD-10-CM | POA: Diagnosis not present

## 2020-09-11 DIAGNOSIS — I152 Hypertension secondary to endocrine disorders: Secondary | ICD-10-CM | POA: Diagnosis not present

## 2020-09-11 DIAGNOSIS — I50810 Right heart failure, unspecified: Secondary | ICD-10-CM | POA: Diagnosis not present

## 2020-09-11 DIAGNOSIS — Z4781 Encounter for orthopedic aftercare following surgical amputation: Secondary | ICD-10-CM | POA: Diagnosis not present

## 2020-09-11 DIAGNOSIS — E1151 Type 2 diabetes mellitus with diabetic peripheral angiopathy without gangrene: Secondary | ICD-10-CM | POA: Diagnosis not present

## 2020-09-11 DIAGNOSIS — E1159 Type 2 diabetes mellitus with other circulatory complications: Secondary | ICD-10-CM | POA: Diagnosis not present

## 2020-09-14 DIAGNOSIS — E1122 Type 2 diabetes mellitus with diabetic chronic kidney disease: Secondary | ICD-10-CM | POA: Diagnosis not present

## 2020-09-14 DIAGNOSIS — N183 Chronic kidney disease, stage 3 unspecified: Secondary | ICD-10-CM | POA: Diagnosis not present

## 2020-09-14 DIAGNOSIS — I50810 Right heart failure, unspecified: Secondary | ICD-10-CM | POA: Diagnosis not present

## 2020-09-14 DIAGNOSIS — I152 Hypertension secondary to endocrine disorders: Secondary | ICD-10-CM | POA: Diagnosis not present

## 2020-09-14 DIAGNOSIS — E1159 Type 2 diabetes mellitus with other circulatory complications: Secondary | ICD-10-CM | POA: Diagnosis not present

## 2020-09-14 DIAGNOSIS — I5032 Chronic diastolic (congestive) heart failure: Secondary | ICD-10-CM | POA: Diagnosis not present

## 2020-09-14 DIAGNOSIS — E1151 Type 2 diabetes mellitus with diabetic peripheral angiopathy without gangrene: Secondary | ICD-10-CM | POA: Diagnosis not present

## 2020-09-14 DIAGNOSIS — Z44122 Encounter for fitting and adjustment of partial artificial left leg: Secondary | ICD-10-CM | POA: Diagnosis not present

## 2020-09-14 DIAGNOSIS — Z4781 Encounter for orthopedic aftercare following surgical amputation: Secondary | ICD-10-CM | POA: Diagnosis not present

## 2020-09-22 DIAGNOSIS — M5382 Other specified dorsopathies, cervical region: Secondary | ICD-10-CM | POA: Diagnosis not present

## 2020-09-22 DIAGNOSIS — M5459 Other low back pain: Secondary | ICD-10-CM | POA: Diagnosis not present

## 2020-09-22 DIAGNOSIS — I50810 Right heart failure, unspecified: Secondary | ICD-10-CM | POA: Diagnosis not present

## 2020-09-22 DIAGNOSIS — B952 Enterococcus as the cause of diseases classified elsewhere: Secondary | ICD-10-CM | POA: Diagnosis not present

## 2020-09-22 DIAGNOSIS — R7881 Bacteremia: Secondary | ICD-10-CM | POA: Diagnosis not present

## 2020-09-22 DIAGNOSIS — Z950 Presence of cardiac pacemaker: Secondary | ICD-10-CM | POA: Diagnosis not present

## 2020-09-22 DIAGNOSIS — E118 Type 2 diabetes mellitus with unspecified complications: Secondary | ICD-10-CM | POA: Diagnosis not present

## 2020-09-23 DIAGNOSIS — I5032 Chronic diastolic (congestive) heart failure: Secondary | ICD-10-CM | POA: Diagnosis not present

## 2020-09-23 DIAGNOSIS — I739 Peripheral vascular disease, unspecified: Secondary | ICD-10-CM | POA: Diagnosis not present

## 2020-09-23 DIAGNOSIS — I270 Primary pulmonary hypertension: Secondary | ICD-10-CM | POA: Diagnosis not present

## 2020-09-24 DIAGNOSIS — E1159 Type 2 diabetes mellitus with other circulatory complications: Secondary | ICD-10-CM | POA: Diagnosis not present

## 2020-09-24 DIAGNOSIS — I152 Hypertension secondary to endocrine disorders: Secondary | ICD-10-CM | POA: Diagnosis not present

## 2020-09-24 DIAGNOSIS — Z4781 Encounter for orthopedic aftercare following surgical amputation: Secondary | ICD-10-CM | POA: Diagnosis not present

## 2020-09-24 DIAGNOSIS — E1151 Type 2 diabetes mellitus with diabetic peripheral angiopathy without gangrene: Secondary | ICD-10-CM | POA: Diagnosis not present

## 2020-09-24 DIAGNOSIS — I50810 Right heart failure, unspecified: Secondary | ICD-10-CM | POA: Diagnosis not present

## 2020-09-24 DIAGNOSIS — I5032 Chronic diastolic (congestive) heart failure: Secondary | ICD-10-CM | POA: Diagnosis not present

## 2020-09-24 DIAGNOSIS — E1122 Type 2 diabetes mellitus with diabetic chronic kidney disease: Secondary | ICD-10-CM | POA: Diagnosis not present

## 2020-09-24 DIAGNOSIS — Z44122 Encounter for fitting and adjustment of partial artificial left leg: Secondary | ICD-10-CM | POA: Diagnosis not present

## 2020-09-24 DIAGNOSIS — N183 Chronic kidney disease, stage 3 unspecified: Secondary | ICD-10-CM | POA: Diagnosis not present

## 2020-09-30 DIAGNOSIS — I152 Hypertension secondary to endocrine disorders: Secondary | ICD-10-CM | POA: Diagnosis not present

## 2020-09-30 DIAGNOSIS — E1151 Type 2 diabetes mellitus with diabetic peripheral angiopathy without gangrene: Secondary | ICD-10-CM | POA: Diagnosis not present

## 2020-09-30 DIAGNOSIS — Z4781 Encounter for orthopedic aftercare following surgical amputation: Secondary | ICD-10-CM | POA: Diagnosis not present

## 2020-09-30 DIAGNOSIS — E1122 Type 2 diabetes mellitus with diabetic chronic kidney disease: Secondary | ICD-10-CM | POA: Diagnosis not present

## 2020-09-30 DIAGNOSIS — E1165 Type 2 diabetes mellitus with hyperglycemia: Secondary | ICD-10-CM | POA: Diagnosis not present

## 2020-09-30 DIAGNOSIS — I50810 Right heart failure, unspecified: Secondary | ICD-10-CM | POA: Diagnosis not present

## 2020-09-30 DIAGNOSIS — Z44122 Encounter for fitting and adjustment of partial artificial left leg: Secondary | ICD-10-CM | POA: Diagnosis not present

## 2020-09-30 DIAGNOSIS — I5032 Chronic diastolic (congestive) heart failure: Secondary | ICD-10-CM | POA: Diagnosis not present

## 2020-09-30 DIAGNOSIS — E1159 Type 2 diabetes mellitus with other circulatory complications: Secondary | ICD-10-CM | POA: Diagnosis not present

## 2020-09-30 DIAGNOSIS — N183 Chronic kidney disease, stage 3 unspecified: Secondary | ICD-10-CM | POA: Diagnosis not present

## 2020-10-01 DIAGNOSIS — M5459 Other low back pain: Secondary | ICD-10-CM | POA: Diagnosis not present

## 2020-10-01 DIAGNOSIS — M545 Low back pain, unspecified: Secondary | ICD-10-CM | POA: Diagnosis not present

## 2020-10-01 DIAGNOSIS — I50810 Right heart failure, unspecified: Secondary | ICD-10-CM | POA: Diagnosis not present

## 2020-10-01 DIAGNOSIS — Z6830 Body mass index (BMI) 30.0-30.9, adult: Secondary | ICD-10-CM | POA: Diagnosis not present

## 2020-10-01 DIAGNOSIS — L02212 Cutaneous abscess of back [any part, except buttock]: Secondary | ICD-10-CM | POA: Diagnosis not present

## 2020-10-01 DIAGNOSIS — S80811A Abrasion, right lower leg, initial encounter: Secondary | ICD-10-CM | POA: Diagnosis not present

## 2020-10-01 DIAGNOSIS — R7881 Bacteremia: Secondary | ICD-10-CM | POA: Diagnosis not present

## 2020-10-01 DIAGNOSIS — Z79899 Other long term (current) drug therapy: Secondary | ICD-10-CM | POA: Diagnosis not present

## 2020-10-01 DIAGNOSIS — G2581 Restless legs syndrome: Secondary | ICD-10-CM | POA: Diagnosis not present

## 2020-10-05 DIAGNOSIS — E1159 Type 2 diabetes mellitus with other circulatory complications: Secondary | ICD-10-CM | POA: Diagnosis not present

## 2020-10-05 DIAGNOSIS — N183 Chronic kidney disease, stage 3 unspecified: Secondary | ICD-10-CM | POA: Diagnosis not present

## 2020-10-05 DIAGNOSIS — Z44122 Encounter for fitting and adjustment of partial artificial left leg: Secondary | ICD-10-CM | POA: Diagnosis not present

## 2020-10-05 DIAGNOSIS — E1122 Type 2 diabetes mellitus with diabetic chronic kidney disease: Secondary | ICD-10-CM | POA: Diagnosis not present

## 2020-10-05 DIAGNOSIS — I152 Hypertension secondary to endocrine disorders: Secondary | ICD-10-CM | POA: Diagnosis not present

## 2020-10-05 DIAGNOSIS — I50810 Right heart failure, unspecified: Secondary | ICD-10-CM | POA: Diagnosis not present

## 2020-10-05 DIAGNOSIS — Z4781 Encounter for orthopedic aftercare following surgical amputation: Secondary | ICD-10-CM | POA: Diagnosis not present

## 2020-10-05 DIAGNOSIS — I5032 Chronic diastolic (congestive) heart failure: Secondary | ICD-10-CM | POA: Diagnosis not present

## 2020-10-05 DIAGNOSIS — E1151 Type 2 diabetes mellitus with diabetic peripheral angiopathy without gangrene: Secondary | ICD-10-CM | POA: Diagnosis not present

## 2020-10-06 DIAGNOSIS — L089 Local infection of the skin and subcutaneous tissue, unspecified: Secondary | ICD-10-CM | POA: Diagnosis not present

## 2020-10-06 DIAGNOSIS — L723 Sebaceous cyst: Secondary | ICD-10-CM | POA: Diagnosis not present

## 2020-10-08 DIAGNOSIS — N183 Chronic kidney disease, stage 3 unspecified: Secondary | ICD-10-CM | POA: Diagnosis not present

## 2020-10-08 DIAGNOSIS — Z4781 Encounter for orthopedic aftercare following surgical amputation: Secondary | ICD-10-CM | POA: Diagnosis not present

## 2020-10-08 DIAGNOSIS — E1122 Type 2 diabetes mellitus with diabetic chronic kidney disease: Secondary | ICD-10-CM | POA: Diagnosis not present

## 2020-10-08 DIAGNOSIS — I5032 Chronic diastolic (congestive) heart failure: Secondary | ICD-10-CM | POA: Diagnosis not present

## 2020-10-08 DIAGNOSIS — E1159 Type 2 diabetes mellitus with other circulatory complications: Secondary | ICD-10-CM | POA: Diagnosis not present

## 2020-10-08 DIAGNOSIS — I152 Hypertension secondary to endocrine disorders: Secondary | ICD-10-CM | POA: Diagnosis not present

## 2020-10-08 DIAGNOSIS — I50810 Right heart failure, unspecified: Secondary | ICD-10-CM | POA: Diagnosis not present

## 2020-10-08 DIAGNOSIS — Z44122 Encounter for fitting and adjustment of partial artificial left leg: Secondary | ICD-10-CM | POA: Diagnosis not present

## 2020-10-08 DIAGNOSIS — E1151 Type 2 diabetes mellitus with diabetic peripheral angiopathy without gangrene: Secondary | ICD-10-CM | POA: Diagnosis not present

## 2020-10-13 DIAGNOSIS — N183 Chronic kidney disease, stage 3 unspecified: Secondary | ICD-10-CM | POA: Diagnosis not present

## 2020-10-13 DIAGNOSIS — I152 Hypertension secondary to endocrine disorders: Secondary | ICD-10-CM | POA: Diagnosis not present

## 2020-10-13 DIAGNOSIS — E1122 Type 2 diabetes mellitus with diabetic chronic kidney disease: Secondary | ICD-10-CM | POA: Diagnosis not present

## 2020-10-13 DIAGNOSIS — Z4781 Encounter for orthopedic aftercare following surgical amputation: Secondary | ICD-10-CM | POA: Diagnosis not present

## 2020-10-13 DIAGNOSIS — Z44122 Encounter for fitting and adjustment of partial artificial left leg: Secondary | ICD-10-CM | POA: Diagnosis not present

## 2020-10-13 DIAGNOSIS — E1151 Type 2 diabetes mellitus with diabetic peripheral angiopathy without gangrene: Secondary | ICD-10-CM | POA: Diagnosis not present

## 2020-10-13 DIAGNOSIS — I50810 Right heart failure, unspecified: Secondary | ICD-10-CM | POA: Diagnosis not present

## 2020-10-13 DIAGNOSIS — I5032 Chronic diastolic (congestive) heart failure: Secondary | ICD-10-CM | POA: Diagnosis not present

## 2020-10-13 DIAGNOSIS — E1159 Type 2 diabetes mellitus with other circulatory complications: Secondary | ICD-10-CM | POA: Diagnosis not present

## 2020-10-14 DIAGNOSIS — Z4781 Encounter for orthopedic aftercare following surgical amputation: Secondary | ICD-10-CM | POA: Diagnosis not present

## 2020-10-14 DIAGNOSIS — N183 Chronic kidney disease, stage 3 unspecified: Secondary | ICD-10-CM | POA: Diagnosis not present

## 2020-10-14 DIAGNOSIS — I152 Hypertension secondary to endocrine disorders: Secondary | ICD-10-CM | POA: Diagnosis not present

## 2020-10-14 DIAGNOSIS — E1151 Type 2 diabetes mellitus with diabetic peripheral angiopathy without gangrene: Secondary | ICD-10-CM | POA: Diagnosis not present

## 2020-10-14 DIAGNOSIS — E1159 Type 2 diabetes mellitus with other circulatory complications: Secondary | ICD-10-CM | POA: Diagnosis not present

## 2020-10-14 DIAGNOSIS — I5032 Chronic diastolic (congestive) heart failure: Secondary | ICD-10-CM | POA: Diagnosis not present

## 2020-10-14 DIAGNOSIS — E1122 Type 2 diabetes mellitus with diabetic chronic kidney disease: Secondary | ICD-10-CM | POA: Diagnosis not present

## 2020-10-14 DIAGNOSIS — Z44122 Encounter for fitting and adjustment of partial artificial left leg: Secondary | ICD-10-CM | POA: Diagnosis not present

## 2020-10-14 DIAGNOSIS — I50810 Right heart failure, unspecified: Secondary | ICD-10-CM | POA: Diagnosis not present

## 2020-10-20 DIAGNOSIS — E1159 Type 2 diabetes mellitus with other circulatory complications: Secondary | ICD-10-CM | POA: Diagnosis not present

## 2020-10-20 DIAGNOSIS — I5032 Chronic diastolic (congestive) heart failure: Secondary | ICD-10-CM | POA: Diagnosis not present

## 2020-10-20 DIAGNOSIS — Z44122 Encounter for fitting and adjustment of partial artificial left leg: Secondary | ICD-10-CM | POA: Diagnosis not present

## 2020-10-20 DIAGNOSIS — N183 Chronic kidney disease, stage 3 unspecified: Secondary | ICD-10-CM | POA: Diagnosis not present

## 2020-10-20 DIAGNOSIS — Z4781 Encounter for orthopedic aftercare following surgical amputation: Secondary | ICD-10-CM | POA: Diagnosis not present

## 2020-10-20 DIAGNOSIS — I152 Hypertension secondary to endocrine disorders: Secondary | ICD-10-CM | POA: Diagnosis not present

## 2020-10-20 DIAGNOSIS — I50810 Right heart failure, unspecified: Secondary | ICD-10-CM | POA: Diagnosis not present

## 2020-10-20 DIAGNOSIS — E1122 Type 2 diabetes mellitus with diabetic chronic kidney disease: Secondary | ICD-10-CM | POA: Diagnosis not present

## 2020-10-20 DIAGNOSIS — E1151 Type 2 diabetes mellitus with diabetic peripheral angiopathy without gangrene: Secondary | ICD-10-CM | POA: Diagnosis not present

## 2020-10-23 DIAGNOSIS — M5382 Other specified dorsopathies, cervical region: Secondary | ICD-10-CM | POA: Diagnosis not present

## 2020-10-23 DIAGNOSIS — N189 Chronic kidney disease, unspecified: Secondary | ICD-10-CM | POA: Diagnosis not present

## 2020-10-23 DIAGNOSIS — I50810 Right heart failure, unspecified: Secondary | ICD-10-CM | POA: Diagnosis not present

## 2020-10-23 DIAGNOSIS — I10 Essential (primary) hypertension: Secondary | ICD-10-CM | POA: Diagnosis not present

## 2020-10-23 DIAGNOSIS — Z952 Presence of prosthetic heart valve: Secondary | ICD-10-CM | POA: Diagnosis not present

## 2020-10-23 DIAGNOSIS — M5459 Other low back pain: Secondary | ICD-10-CM | POA: Diagnosis not present

## 2020-10-23 DIAGNOSIS — N401 Enlarged prostate with lower urinary tract symptoms: Secondary | ICD-10-CM | POA: Diagnosis not present

## 2020-10-23 DIAGNOSIS — K219 Gastro-esophageal reflux disease without esophagitis: Secondary | ICD-10-CM | POA: Diagnosis not present

## 2020-10-23 DIAGNOSIS — G2581 Restless legs syndrome: Secondary | ICD-10-CM | POA: Diagnosis not present

## 2020-10-24 DIAGNOSIS — I5032 Chronic diastolic (congestive) heart failure: Secondary | ICD-10-CM | POA: Diagnosis not present

## 2020-10-24 DIAGNOSIS — I739 Peripheral vascular disease, unspecified: Secondary | ICD-10-CM | POA: Diagnosis not present

## 2020-10-24 DIAGNOSIS — I270 Primary pulmonary hypertension: Secondary | ICD-10-CM | POA: Diagnosis not present

## 2020-10-26 DIAGNOSIS — E1122 Type 2 diabetes mellitus with diabetic chronic kidney disease: Secondary | ICD-10-CM | POA: Diagnosis not present

## 2020-10-26 DIAGNOSIS — Z794 Long term (current) use of insulin: Secondary | ICD-10-CM | POA: Diagnosis not present

## 2020-10-26 DIAGNOSIS — E1151 Type 2 diabetes mellitus with diabetic peripheral angiopathy without gangrene: Secondary | ICD-10-CM | POA: Diagnosis not present

## 2020-10-26 DIAGNOSIS — I2510 Atherosclerotic heart disease of native coronary artery without angina pectoris: Secondary | ICD-10-CM | POA: Diagnosis not present

## 2020-10-26 DIAGNOSIS — N1832 Chronic kidney disease, stage 3b: Secondary | ICD-10-CM | POA: Diagnosis not present

## 2020-10-26 DIAGNOSIS — Z89612 Acquired absence of left leg above knee: Secondary | ICD-10-CM | POA: Diagnosis not present

## 2020-10-26 DIAGNOSIS — E1142 Type 2 diabetes mellitus with diabetic polyneuropathy: Secondary | ICD-10-CM | POA: Diagnosis not present

## 2020-10-28 DIAGNOSIS — N183 Chronic kidney disease, stage 3 unspecified: Secondary | ICD-10-CM | POA: Diagnosis not present

## 2020-10-28 DIAGNOSIS — I50810 Right heart failure, unspecified: Secondary | ICD-10-CM | POA: Diagnosis not present

## 2020-10-28 DIAGNOSIS — I5032 Chronic diastolic (congestive) heart failure: Secondary | ICD-10-CM | POA: Diagnosis not present

## 2020-10-28 DIAGNOSIS — E1122 Type 2 diabetes mellitus with diabetic chronic kidney disease: Secondary | ICD-10-CM | POA: Diagnosis not present

## 2020-10-28 DIAGNOSIS — I152 Hypertension secondary to endocrine disorders: Secondary | ICD-10-CM | POA: Diagnosis not present

## 2020-10-28 DIAGNOSIS — Z44122 Encounter for fitting and adjustment of partial artificial left leg: Secondary | ICD-10-CM | POA: Diagnosis not present

## 2020-10-28 DIAGNOSIS — Z4781 Encounter for orthopedic aftercare following surgical amputation: Secondary | ICD-10-CM | POA: Diagnosis not present

## 2020-10-28 DIAGNOSIS — E1151 Type 2 diabetes mellitus with diabetic peripheral angiopathy without gangrene: Secondary | ICD-10-CM | POA: Diagnosis not present

## 2020-10-28 DIAGNOSIS — E1159 Type 2 diabetes mellitus with other circulatory complications: Secondary | ICD-10-CM | POA: Diagnosis not present

## 2020-10-29 DIAGNOSIS — Z8679 Personal history of other diseases of the circulatory system: Secondary | ICD-10-CM | POA: Diagnosis not present

## 2020-10-29 DIAGNOSIS — I420 Dilated cardiomyopathy: Secondary | ICD-10-CM | POA: Diagnosis not present

## 2020-10-29 DIAGNOSIS — Z952 Presence of prosthetic heart valve: Secondary | ICD-10-CM | POA: Diagnosis not present

## 2020-10-29 DIAGNOSIS — E1159 Type 2 diabetes mellitus with other circulatory complications: Secondary | ICD-10-CM | POA: Diagnosis not present

## 2020-10-29 DIAGNOSIS — I152 Hypertension secondary to endocrine disorders: Secondary | ICD-10-CM | POA: Diagnosis not present

## 2020-11-02 DIAGNOSIS — N183 Chronic kidney disease, stage 3 unspecified: Secondary | ICD-10-CM | POA: Diagnosis not present

## 2020-11-02 DIAGNOSIS — I152 Hypertension secondary to endocrine disorders: Secondary | ICD-10-CM | POA: Diagnosis not present

## 2020-11-02 DIAGNOSIS — E1122 Type 2 diabetes mellitus with diabetic chronic kidney disease: Secondary | ICD-10-CM | POA: Diagnosis not present

## 2020-11-02 DIAGNOSIS — Z44122 Encounter for fitting and adjustment of partial artificial left leg: Secondary | ICD-10-CM | POA: Diagnosis not present

## 2020-11-02 DIAGNOSIS — I50810 Right heart failure, unspecified: Secondary | ICD-10-CM | POA: Diagnosis not present

## 2020-11-02 DIAGNOSIS — Z4781 Encounter for orthopedic aftercare following surgical amputation: Secondary | ICD-10-CM | POA: Diagnosis not present

## 2020-11-02 DIAGNOSIS — E1159 Type 2 diabetes mellitus with other circulatory complications: Secondary | ICD-10-CM | POA: Diagnosis not present

## 2020-11-02 DIAGNOSIS — E1151 Type 2 diabetes mellitus with diabetic peripheral angiopathy without gangrene: Secondary | ICD-10-CM | POA: Diagnosis not present

## 2020-11-02 DIAGNOSIS — I5032 Chronic diastolic (congestive) heart failure: Secondary | ICD-10-CM | POA: Diagnosis not present

## 2020-11-16 DIAGNOSIS — I509 Heart failure, unspecified: Secondary | ICD-10-CM | POA: Diagnosis not present

## 2020-11-16 DIAGNOSIS — I442 Atrioventricular block, complete: Secondary | ICD-10-CM | POA: Diagnosis not present

## 2020-11-16 DIAGNOSIS — I428 Other cardiomyopathies: Secondary | ICD-10-CM | POA: Diagnosis not present

## 2020-11-16 DIAGNOSIS — I495 Sick sinus syndrome: Secondary | ICD-10-CM | POA: Diagnosis not present

## 2020-11-16 DIAGNOSIS — I480 Paroxysmal atrial fibrillation: Secondary | ICD-10-CM | POA: Diagnosis not present

## 2020-11-16 DIAGNOSIS — Z950 Presence of cardiac pacemaker: Secondary | ICD-10-CM | POA: Diagnosis not present

## 2020-11-17 DIAGNOSIS — H35443 Age-related reticular degeneration of retina, bilateral: Secondary | ICD-10-CM | POA: Diagnosis not present

## 2020-11-17 DIAGNOSIS — E119 Type 2 diabetes mellitus without complications: Secondary | ICD-10-CM | POA: Diagnosis not present

## 2020-11-17 DIAGNOSIS — Z961 Presence of intraocular lens: Secondary | ICD-10-CM | POA: Diagnosis not present

## 2020-11-17 DIAGNOSIS — H524 Presbyopia: Secondary | ICD-10-CM | POA: Diagnosis not present

## 2020-11-17 DIAGNOSIS — H43813 Vitreous degeneration, bilateral: Secondary | ICD-10-CM | POA: Diagnosis not present

## 2020-11-17 DIAGNOSIS — E1165 Type 2 diabetes mellitus with hyperglycemia: Secondary | ICD-10-CM | POA: Diagnosis not present

## 2020-11-17 DIAGNOSIS — H5203 Hypermetropia, bilateral: Secondary | ICD-10-CM | POA: Diagnosis not present

## 2020-11-17 DIAGNOSIS — H52223 Regular astigmatism, bilateral: Secondary | ICD-10-CM | POA: Diagnosis not present

## 2020-11-17 DIAGNOSIS — H35373 Puckering of macula, bilateral: Secondary | ICD-10-CM | POA: Diagnosis not present

## 2020-11-23 DIAGNOSIS — I5032 Chronic diastolic (congestive) heart failure: Secondary | ICD-10-CM | POA: Diagnosis not present

## 2020-11-23 DIAGNOSIS — I270 Primary pulmonary hypertension: Secondary | ICD-10-CM | POA: Diagnosis not present

## 2020-11-23 DIAGNOSIS — I739 Peripheral vascular disease, unspecified: Secondary | ICD-10-CM | POA: Diagnosis not present

## 2020-12-01 DIAGNOSIS — M5459 Other low back pain: Secondary | ICD-10-CM | POA: Diagnosis not present

## 2020-12-01 DIAGNOSIS — E785 Hyperlipidemia, unspecified: Secondary | ICD-10-CM | POA: Diagnosis not present

## 2020-12-01 DIAGNOSIS — Z950 Presence of cardiac pacemaker: Secondary | ICD-10-CM | POA: Diagnosis not present

## 2020-12-01 DIAGNOSIS — K219 Gastro-esophageal reflux disease without esophagitis: Secondary | ICD-10-CM | POA: Diagnosis not present

## 2020-12-01 DIAGNOSIS — N189 Chronic kidney disease, unspecified: Secondary | ICD-10-CM | POA: Diagnosis not present

## 2020-12-01 DIAGNOSIS — N401 Enlarged prostate with lower urinary tract symptoms: Secondary | ICD-10-CM | POA: Diagnosis not present

## 2020-12-01 DIAGNOSIS — Z952 Presence of prosthetic heart valve: Secondary | ICD-10-CM | POA: Diagnosis not present

## 2020-12-01 DIAGNOSIS — G2581 Restless legs syndrome: Secondary | ICD-10-CM | POA: Diagnosis not present

## 2020-12-01 DIAGNOSIS — E118 Type 2 diabetes mellitus with unspecified complications: Secondary | ICD-10-CM | POA: Diagnosis not present

## 2020-12-04 DIAGNOSIS — E1165 Type 2 diabetes mellitus with hyperglycemia: Secondary | ICD-10-CM | POA: Diagnosis not present

## 2020-12-04 DIAGNOSIS — Z89612 Acquired absence of left leg above knee: Secondary | ICD-10-CM | POA: Diagnosis not present

## 2020-12-04 DIAGNOSIS — E1122 Type 2 diabetes mellitus with diabetic chronic kidney disease: Secondary | ICD-10-CM | POA: Diagnosis not present

## 2020-12-04 DIAGNOSIS — N1832 Chronic kidney disease, stage 3b: Secondary | ICD-10-CM | POA: Diagnosis not present

## 2020-12-04 DIAGNOSIS — Z794 Long term (current) use of insulin: Secondary | ICD-10-CM | POA: Diagnosis not present

## 2020-12-04 DIAGNOSIS — E1151 Type 2 diabetes mellitus with diabetic peripheral angiopathy without gangrene: Secondary | ICD-10-CM | POA: Diagnosis not present

## 2020-12-04 DIAGNOSIS — I2510 Atherosclerotic heart disease of native coronary artery without angina pectoris: Secondary | ICD-10-CM | POA: Diagnosis not present

## 2020-12-04 DIAGNOSIS — E1142 Type 2 diabetes mellitus with diabetic polyneuropathy: Secondary | ICD-10-CM | POA: Diagnosis not present

## 2020-12-17 DIAGNOSIS — E1165 Type 2 diabetes mellitus with hyperglycemia: Secondary | ICD-10-CM | POA: Diagnosis not present

## 2020-12-24 DIAGNOSIS — I739 Peripheral vascular disease, unspecified: Secondary | ICD-10-CM | POA: Diagnosis not present

## 2020-12-24 DIAGNOSIS — I5032 Chronic diastolic (congestive) heart failure: Secondary | ICD-10-CM | POA: Diagnosis not present

## 2020-12-24 DIAGNOSIS — I270 Primary pulmonary hypertension: Secondary | ICD-10-CM | POA: Diagnosis not present

## 2020-12-31 DIAGNOSIS — B379 Candidiasis, unspecified: Secondary | ICD-10-CM | POA: Diagnosis not present

## 2020-12-31 DIAGNOSIS — M5459 Other low back pain: Secondary | ICD-10-CM | POA: Diagnosis not present

## 2021-09-08 DIAGNOSIS — M5459 Other low back pain: Secondary | ICD-10-CM | POA: Diagnosis not present

## 2021-09-08 DIAGNOSIS — B379 Candidiasis, unspecified: Secondary | ICD-10-CM | POA: Diagnosis not present

## 2021-09-08 DIAGNOSIS — Z9181 History of falling: Secondary | ICD-10-CM | POA: Diagnosis not present

## 2021-09-08 DIAGNOSIS — N401 Enlarged prostate with lower urinary tract symptoms: Secondary | ICD-10-CM | POA: Diagnosis not present

## 2021-09-08 DIAGNOSIS — Z139 Encounter for screening, unspecified: Secondary | ICD-10-CM | POA: Diagnosis not present

## 2021-09-09 DIAGNOSIS — L578 Other skin changes due to chronic exposure to nonionizing radiation: Secondary | ICD-10-CM | POA: Diagnosis not present

## 2021-09-09 DIAGNOSIS — L820 Inflamed seborrheic keratosis: Secondary | ICD-10-CM | POA: Diagnosis not present

## 2021-09-09 DIAGNOSIS — L821 Other seborrheic keratosis: Secondary | ICD-10-CM | POA: Diagnosis not present

## 2021-09-14 DIAGNOSIS — I420 Dilated cardiomyopathy: Secondary | ICD-10-CM | POA: Diagnosis not present

## 2021-09-21 DIAGNOSIS — I480 Paroxysmal atrial fibrillation: Secondary | ICD-10-CM | POA: Diagnosis not present

## 2021-09-21 DIAGNOSIS — I442 Atrioventricular block, complete: Secondary | ICD-10-CM | POA: Diagnosis not present

## 2021-09-21 DIAGNOSIS — I428 Other cardiomyopathies: Secondary | ICD-10-CM | POA: Diagnosis not present

## 2021-09-21 DIAGNOSIS — I509 Heart failure, unspecified: Secondary | ICD-10-CM | POA: Diagnosis not present

## 2021-09-23 DIAGNOSIS — E1142 Type 2 diabetes mellitus with diabetic polyneuropathy: Secondary | ICD-10-CM | POA: Diagnosis not present

## 2021-09-23 DIAGNOSIS — E1122 Type 2 diabetes mellitus with diabetic chronic kidney disease: Secondary | ICD-10-CM | POA: Diagnosis not present

## 2021-09-23 DIAGNOSIS — E1151 Type 2 diabetes mellitus with diabetic peripheral angiopathy without gangrene: Secondary | ICD-10-CM | POA: Diagnosis not present

## 2021-09-23 DIAGNOSIS — Z89612 Acquired absence of left leg above knee: Secondary | ICD-10-CM | POA: Diagnosis not present

## 2021-09-23 DIAGNOSIS — I2510 Atherosclerotic heart disease of native coronary artery without angina pectoris: Secondary | ICD-10-CM | POA: Diagnosis not present

## 2021-09-23 DIAGNOSIS — Z794 Long term (current) use of insulin: Secondary | ICD-10-CM | POA: Diagnosis not present

## 2021-09-23 DIAGNOSIS — N1832 Chronic kidney disease, stage 3b: Secondary | ICD-10-CM | POA: Diagnosis not present

## 2021-10-07 DIAGNOSIS — N200 Calculus of kidney: Secondary | ICD-10-CM | POA: Diagnosis not present

## 2021-10-07 DIAGNOSIS — I509 Heart failure, unspecified: Secondary | ICD-10-CM | POA: Diagnosis not present

## 2021-10-07 DIAGNOSIS — N281 Cyst of kidney, acquired: Secondary | ICD-10-CM | POA: Diagnosis not present

## 2021-10-07 DIAGNOSIS — N1832 Chronic kidney disease, stage 3b: Secondary | ICD-10-CM | POA: Diagnosis not present

## 2021-10-07 DIAGNOSIS — I129 Hypertensive chronic kidney disease with stage 1 through stage 4 chronic kidney disease, or unspecified chronic kidney disease: Secondary | ICD-10-CM | POA: Diagnosis not present

## 2021-10-07 DIAGNOSIS — E875 Hyperkalemia: Secondary | ICD-10-CM | POA: Diagnosis not present

## 2021-10-08 ENCOUNTER — Other Ambulatory Visit: Payer: Self-pay | Admitting: Internal Medicine

## 2021-10-08 DIAGNOSIS — N281 Cyst of kidney, acquired: Secondary | ICD-10-CM

## 2021-10-11 DIAGNOSIS — M5459 Other low back pain: Secondary | ICD-10-CM | POA: Diagnosis not present

## 2021-10-11 DIAGNOSIS — I129 Hypertensive chronic kidney disease with stage 1 through stage 4 chronic kidney disease, or unspecified chronic kidney disease: Secondary | ICD-10-CM | POA: Diagnosis not present

## 2021-11-04 DIAGNOSIS — Z952 Presence of prosthetic heart valve: Secondary | ICD-10-CM | POA: Diagnosis not present

## 2021-11-04 DIAGNOSIS — I420 Dilated cardiomyopathy: Secondary | ICD-10-CM | POA: Diagnosis not present

## 2021-11-04 DIAGNOSIS — I10 Essential (primary) hypertension: Secondary | ICD-10-CM | POA: Diagnosis not present

## 2021-11-15 DIAGNOSIS — M5459 Other low back pain: Secondary | ICD-10-CM | POA: Diagnosis not present

## 2021-11-15 DIAGNOSIS — I480 Paroxysmal atrial fibrillation: Secondary | ICD-10-CM | POA: Diagnosis not present

## 2021-11-15 DIAGNOSIS — I495 Sick sinus syndrome: Secondary | ICD-10-CM | POA: Diagnosis not present

## 2021-11-15 DIAGNOSIS — Z45018 Encounter for adjustment and management of other part of cardiac pacemaker: Secondary | ICD-10-CM | POA: Diagnosis not present

## 2021-11-15 DIAGNOSIS — I509 Heart failure, unspecified: Secondary | ICD-10-CM | POA: Diagnosis not present

## 2021-11-15 DIAGNOSIS — I429 Cardiomyopathy, unspecified: Secondary | ICD-10-CM | POA: Diagnosis not present

## 2021-11-15 DIAGNOSIS — I442 Atrioventricular block, complete: Secondary | ICD-10-CM | POA: Diagnosis not present

## 2021-11-15 DIAGNOSIS — I428 Other cardiomyopathies: Secondary | ICD-10-CM | POA: Diagnosis not present

## 2021-11-17 DIAGNOSIS — N471 Phimosis: Secondary | ICD-10-CM | POA: Diagnosis not present

## 2021-11-17 DIAGNOSIS — N3941 Urge incontinence: Secondary | ICD-10-CM | POA: Diagnosis not present

## 2021-11-17 DIAGNOSIS — R351 Nocturia: Secondary | ICD-10-CM | POA: Diagnosis not present

## 2021-11-18 DIAGNOSIS — H524 Presbyopia: Secondary | ICD-10-CM | POA: Diagnosis not present

## 2021-11-18 DIAGNOSIS — H35443 Age-related reticular degeneration of retina, bilateral: Secondary | ICD-10-CM | POA: Diagnosis not present

## 2021-11-18 DIAGNOSIS — E119 Type 2 diabetes mellitus without complications: Secondary | ICD-10-CM | POA: Diagnosis not present

## 2021-11-18 DIAGNOSIS — H43813 Vitreous degeneration, bilateral: Secondary | ICD-10-CM | POA: Diagnosis not present

## 2021-11-18 DIAGNOSIS — H52223 Regular astigmatism, bilateral: Secondary | ICD-10-CM | POA: Diagnosis not present

## 2021-11-18 DIAGNOSIS — H35373 Puckering of macula, bilateral: Secondary | ICD-10-CM | POA: Diagnosis not present

## 2021-11-18 DIAGNOSIS — Z961 Presence of intraocular lens: Secondary | ICD-10-CM | POA: Diagnosis not present

## 2021-11-18 DIAGNOSIS — H5203 Hypermetropia, bilateral: Secondary | ICD-10-CM | POA: Diagnosis not present

## 2021-12-08 DIAGNOSIS — E1165 Type 2 diabetes mellitus with hyperglycemia: Secondary | ICD-10-CM | POA: Diagnosis not present

## 2021-12-16 DIAGNOSIS — I50810 Right heart failure, unspecified: Secondary | ICD-10-CM | POA: Diagnosis not present

## 2021-12-16 DIAGNOSIS — Z125 Encounter for screening for malignant neoplasm of prostate: Secondary | ICD-10-CM | POA: Diagnosis not present

## 2021-12-16 DIAGNOSIS — K219 Gastro-esophageal reflux disease without esophagitis: Secondary | ICD-10-CM | POA: Diagnosis not present

## 2021-12-16 DIAGNOSIS — Z79899 Other long term (current) drug therapy: Secondary | ICD-10-CM | POA: Diagnosis not present

## 2021-12-16 DIAGNOSIS — I129 Hypertensive chronic kidney disease with stage 1 through stage 4 chronic kidney disease, or unspecified chronic kidney disease: Secondary | ICD-10-CM | POA: Diagnosis not present

## 2021-12-16 DIAGNOSIS — E1140 Type 2 diabetes mellitus with diabetic neuropathy, unspecified: Secondary | ICD-10-CM | POA: Diagnosis not present

## 2021-12-16 DIAGNOSIS — E785 Hyperlipidemia, unspecified: Secondary | ICD-10-CM | POA: Diagnosis not present

## 2021-12-16 DIAGNOSIS — M5459 Other low back pain: Secondary | ICD-10-CM | POA: Diagnosis not present

## 2021-12-16 DIAGNOSIS — Z1331 Encounter for screening for depression: Secondary | ICD-10-CM | POA: Diagnosis not present

## 2021-12-27 DIAGNOSIS — R350 Frequency of micturition: Secondary | ICD-10-CM | POA: Diagnosis not present

## 2021-12-27 DIAGNOSIS — N471 Phimosis: Secondary | ICD-10-CM | POA: Diagnosis not present

## 2021-12-27 DIAGNOSIS — N3941 Urge incontinence: Secondary | ICD-10-CM | POA: Diagnosis not present

## 2021-12-27 DIAGNOSIS — R351 Nocturia: Secondary | ICD-10-CM | POA: Diagnosis not present

## 2022-02-01 DIAGNOSIS — E1165 Type 2 diabetes mellitus with hyperglycemia: Secondary | ICD-10-CM | POA: Diagnosis not present

## 2022-02-16 DIAGNOSIS — M5459 Other low back pain: Secondary | ICD-10-CM | POA: Diagnosis not present

## 2022-02-16 DIAGNOSIS — Z23 Encounter for immunization: Secondary | ICD-10-CM | POA: Diagnosis not present

## 2022-03-03 DIAGNOSIS — E1165 Type 2 diabetes mellitus with hyperglycemia: Secondary | ICD-10-CM | POA: Diagnosis not present

## 2022-03-04 DIAGNOSIS — I509 Heart failure, unspecified: Secondary | ICD-10-CM | POA: Diagnosis not present

## 2022-03-04 DIAGNOSIS — I480 Paroxysmal atrial fibrillation: Secondary | ICD-10-CM | POA: Diagnosis not present

## 2022-03-04 DIAGNOSIS — I442 Atrioventricular block, complete: Secondary | ICD-10-CM | POA: Diagnosis not present

## 2022-03-04 DIAGNOSIS — I428 Other cardiomyopathies: Secondary | ICD-10-CM | POA: Diagnosis not present

## 2022-03-09 DEATH — deceased
